# Patient Record
Sex: Female | Born: 1970 | Race: Black or African American | Hispanic: No | Marital: Married | Smoking: Never smoker
Health system: Southern US, Community
[De-identification: ages and names within clinical notes are randomized; demographics above are authoritative.]

## PROBLEM LIST (undated history)

## (undated) ENCOUNTER — Ambulatory Visit (HOSPITAL_COMMUNITY): Admission: EM

## (undated) DIAGNOSIS — K219 Gastro-esophageal reflux disease without esophagitis: Secondary | ICD-10-CM

## (undated) DIAGNOSIS — I10 Essential (primary) hypertension: Secondary | ICD-10-CM

## (undated) DIAGNOSIS — J45909 Unspecified asthma, uncomplicated: Secondary | ICD-10-CM

## (undated) DIAGNOSIS — N390 Urinary tract infection, site not specified: Secondary | ICD-10-CM

## (undated) DIAGNOSIS — Z8489 Family history of other specified conditions: Secondary | ICD-10-CM

---

## 1992-06-02 HISTORY — PX: CHOLECYSTECTOMY: SHX55

## 1996-06-02 HISTORY — PX: TUBAL LIGATION: SHX77

## 1998-04-17 ENCOUNTER — Other Ambulatory Visit: Admission: RE | Admit: 1998-04-17 | Discharge: 1998-04-17 | Payer: Self-pay | Admitting: Obstetrics

## 1998-07-09 ENCOUNTER — Emergency Department (HOSPITAL_COMMUNITY): Admission: EM | Admit: 1998-07-09 | Discharge: 1998-07-09 | Payer: Self-pay | Admitting: Emergency Medicine

## 1999-12-30 ENCOUNTER — Other Ambulatory Visit: Admission: RE | Admit: 1999-12-30 | Discharge: 1999-12-30 | Payer: Self-pay | Admitting: Obstetrics

## 2002-03-02 ENCOUNTER — Encounter: Payer: Self-pay | Admitting: Emergency Medicine

## 2002-03-02 ENCOUNTER — Emergency Department (HOSPITAL_COMMUNITY): Admission: EM | Admit: 2002-03-02 | Discharge: 2002-03-02 | Payer: Self-pay | Admitting: *Deleted

## 2002-04-30 ENCOUNTER — Emergency Department (HOSPITAL_COMMUNITY): Admission: EM | Admit: 2002-04-30 | Discharge: 2002-04-30 | Payer: Self-pay | Admitting: Emergency Medicine

## 2004-03-02 ENCOUNTER — Emergency Department (HOSPITAL_COMMUNITY): Admission: EM | Admit: 2004-03-02 | Discharge: 2004-03-02 | Payer: Self-pay | Admitting: Emergency Medicine

## 2005-08-11 ENCOUNTER — Emergency Department (HOSPITAL_COMMUNITY): Admission: EM | Admit: 2005-08-11 | Discharge: 2005-08-11 | Payer: Self-pay | Admitting: Family Medicine

## 2006-07-12 ENCOUNTER — Emergency Department (HOSPITAL_COMMUNITY): Admission: EM | Admit: 2006-07-12 | Discharge: 2006-07-12 | Payer: Self-pay | Admitting: Emergency Medicine

## 2008-06-24 ENCOUNTER — Emergency Department (HOSPITAL_COMMUNITY): Admission: EM | Admit: 2008-06-24 | Discharge: 2008-06-24 | Payer: Self-pay | Admitting: Emergency Medicine

## 2008-09-27 ENCOUNTER — Emergency Department (HOSPITAL_COMMUNITY): Admission: EM | Admit: 2008-09-27 | Discharge: 2008-09-27 | Payer: Self-pay | Admitting: Emergency Medicine

## 2008-10-20 ENCOUNTER — Emergency Department (HOSPITAL_COMMUNITY): Admission: EM | Admit: 2008-10-20 | Discharge: 2008-10-20 | Payer: Self-pay | Admitting: Emergency Medicine

## 2010-09-16 LAB — DIFFERENTIAL
Basophils Absolute: 0.1 10*3/uL (ref 0.0–0.1)
Basophils Relative: 1 % (ref 0–1)
Eosinophils Relative: 2 % (ref 0–5)
Lymphocytes Relative: 37 % (ref 12–46)
Monocytes Absolute: 0.5 10*3/uL (ref 0.1–1.0)
Monocytes Relative: 4 % (ref 3–12)

## 2010-09-16 LAB — BASIC METABOLIC PANEL
CO2: 23 mEq/L (ref 19–32)
GFR calc Af Amer: >60 mL/min (ref >60)
Glucose, Bld: 110 mg/dL — ABNORMAL HIGH (ref 70–99)
Potassium: 2.9 mEq/L — ABNORMAL LOW (ref 3.5–5.1)
Sodium: 133 mEq/L — ABNORMAL LOW (ref 135–145)

## 2010-09-16 LAB — CBC
HCT: 29.6 % — ABNORMAL LOW (ref 36.0–46.0)
Hemoglobin: 9.6 g/dL — ABNORMAL LOW (ref 12.0–15.0)
MCHC: 32.4 g/dL (ref 30.0–36.0)
RBC: 3.75 MIL/uL — ABNORMAL LOW (ref 3.87–5.11)
RDW: 17.4 % — ABNORMAL HIGH (ref 11.5–15.5)

## 2010-09-16 LAB — POCT CARDIAC MARKERS: CKMB, poc: <1 ng/mL — ABNORMAL LOW (ref 1.0–8.0)

## 2010-11-06 ENCOUNTER — Emergency Department (HOSPITAL_COMMUNITY)
Admission: EM | Admit: 2010-11-06 | Discharge: 2010-11-06 | Disposition: A | Discharge status: Home / Self Care | Payer: Self-pay | Attending: Emergency Medicine | Admitting: Emergency Medicine

## 2010-11-06 ENCOUNTER — Emergency Department (HOSPITAL_COMMUNITY): Payer: Self-pay

## 2010-11-06 DIAGNOSIS — R0602 Shortness of breath: Secondary | ICD-10-CM | POA: Insufficient documentation

## 2010-11-06 DIAGNOSIS — R072 Precordial pain: Secondary | ICD-10-CM | POA: Insufficient documentation

## 2010-11-06 DIAGNOSIS — R091 Pleurisy: Secondary | ICD-10-CM | POA: Insufficient documentation

## 2010-11-06 DIAGNOSIS — D649 Anemia, unspecified: Secondary | ICD-10-CM | POA: Insufficient documentation

## 2010-11-06 LAB — POCT I-STAT, CHEM 8
Chloride: 107 mEq/L (ref 96–112)
Glucose, Bld: 84 mg/dL (ref 70–99)
HCT: 34 % — ABNORMAL LOW (ref 36.0–46.0)
Hemoglobin: 11.6 g/dL — ABNORMAL LOW (ref 12.0–15.0)
Potassium: 3.6 mEq/L (ref 3.5–5.1)
Sodium: 142 mEq/L (ref 135–145)

## 2010-11-06 LAB — CBC
HCT: 31.7 % — ABNORMAL LOW (ref 36.0–46.0)
Hemoglobin: 10.3 g/dL — ABNORMAL LOW (ref 12.0–15.0)
MCV: 75.7 fL — ABNORMAL LOW (ref 78.0–100.0)
RBC: 4.19 MIL/uL (ref 3.87–5.11)
WBC: 10.4 10*3/uL (ref 4.0–10.5)

## 2010-11-06 LAB — DIFFERENTIAL
Basophils Absolute: 0.1 10*3/uL (ref 0.0–0.1)
Eosinophils Relative: 3 % (ref 0–5)
Lymphocytes Relative: 34 % (ref 12–46)
Lymphs Abs: 3.5 10*3/uL (ref 0.7–4.0)
Neutro Abs: 6.3 10*3/uL (ref 1.7–7.7)

## 2010-11-06 MED ORDER — IOHEXOL 300 MG/ML  SOLN
100.0000 mL | Freq: Once | INTRAMUSCULAR | Status: AC | PRN
  Administered 2010-11-06: 100 mL via INTRAVENOUS

## 2012-08-26 ENCOUNTER — Emergency Department (HOSPITAL_COMMUNITY)
Admission: EM | Admit: 2012-08-26 | Discharge: 2012-08-26 | Disposition: A | Discharge status: Home / Self Care | Payer: Medicaid Other | Attending: Emergency Medicine | Admitting: Emergency Medicine

## 2012-08-26 ENCOUNTER — Encounter (HOSPITAL_COMMUNITY): Payer: Self-pay | Admitting: *Deleted

## 2012-08-26 DIAGNOSIS — Z87440 Personal history of urinary (tract) infections: Secondary | ICD-10-CM | POA: Insufficient documentation

## 2012-08-26 DIAGNOSIS — M545 Low back pain, unspecified: Secondary | ICD-10-CM | POA: Insufficient documentation

## 2012-08-26 DIAGNOSIS — I10 Essential (primary) hypertension: Secondary | ICD-10-CM | POA: Insufficient documentation

## 2012-08-26 DIAGNOSIS — R21 Rash and other nonspecific skin eruption: Secondary | ICD-10-CM | POA: Insufficient documentation

## 2012-08-26 DIAGNOSIS — Z8719 Personal history of other diseases of the digestive system: Secondary | ICD-10-CM | POA: Insufficient documentation

## 2012-08-26 HISTORY — DX: Essential (primary) hypertension: I10

## 2012-08-26 HISTORY — DX: Gastro-esophageal reflux disease without esophagitis: K21.9

## 2012-08-26 HISTORY — DX: Urinary tract infection, site not specified: N39.0

## 2012-08-26 LAB — COMPREHENSIVE METABOLIC PANEL
ALT: 14 U/L (ref 0–35)
Alkaline Phosphatase: 81 U/L (ref 39–117)
BUN: 10 mg/dL (ref 6–23)
CO2: 28 mEq/L (ref 19–32)
Calcium: 9.3 mg/dL (ref 8.4–10.5)
GFR calc Af Amer: 79 mL/min — ABNORMAL LOW (ref >90)
GFR calc non Af Amer: 68 mL/min — ABNORMAL LOW (ref >90)
Glucose, Bld: 95 mg/dL (ref 70–99)
Sodium: 141 mEq/L (ref 135–145)
Total Protein: 8.1 g/dL (ref 6.0–8.3)

## 2012-08-26 LAB — LIPASE, BLOOD: Lipase: 40 U/L (ref 11–59)

## 2012-08-26 LAB — CBC WITH DIFFERENTIAL/PLATELET
Eosinophils Absolute: 0.3 10*3/uL (ref 0.0–0.7)
Eosinophils Relative: 3 % (ref 0–5)
HCT: 30.9 % — ABNORMAL LOW (ref 36.0–46.0)
Hemoglobin: 10 g/dL — ABNORMAL LOW (ref 12.0–15.0)
Lymphocytes Relative: 36 % (ref 12–46)
Lymphs Abs: 4.5 10*3/uL — ABNORMAL HIGH (ref 0.7–4.0)
MCH: 23.6 pg — ABNORMAL LOW (ref 26.0–34.0)
MCV: 73 fL — ABNORMAL LOW (ref 78.0–100.0)
Monocytes Relative: 3 % (ref 3–12)
Platelets: 417 10*3/uL — ABNORMAL HIGH (ref 150–400)
RBC: 4.23 MIL/uL (ref 3.87–5.11)
WBC: 12.7 10*3/uL — ABNORMAL HIGH (ref 4.0–10.5)

## 2012-08-26 LAB — URINALYSIS, ROUTINE W REFLEX MICROSCOPIC
Bilirubin Urine: NEGATIVE
Hgb urine dipstick: NEGATIVE
Nitrite: NEGATIVE
Protein, ur: 30 mg/dL — AB
Urobilinogen, UA: 1 mg/dL (ref 0.0–1.0)

## 2012-08-26 LAB — URINE MICROSCOPIC-ADD ON

## 2012-08-26 MED ORDER — OXYCODONE-ACETAMINOPHEN 5-325 MG PO TABS
1.0000 | ORAL_TABLET | Freq: Once | ORAL | Status: AC
  Administered 2012-08-26: 1 via ORAL
  Filled 2012-08-26: qty 1

## 2012-08-26 MED ORDER — DIAZEPAM 5 MG PO TABS
5.0000 mg | ORAL_TABLET | Freq: Once | ORAL | Status: AC
  Administered 2012-08-26: 5 mg via ORAL
  Filled 2012-08-26: qty 1

## 2012-08-26 MED ORDER — IBUPROFEN 200 MG PO TABS
400.0000 mg | ORAL_TABLET | Freq: Once | ORAL | Status: AC
  Administered 2012-08-26: 400 mg via ORAL
  Filled 2012-08-26: qty 2

## 2012-08-26 MED ORDER — OXYCODONE-ACETAMINOPHEN 5-325 MG PO TABS: ORAL_TABLET | ORAL | Status: DC

## 2012-08-26 NOTE — ED Provider Notes (Signed)
History     CSN: [id number]  Arrival date & time 08/26/12  4098   First MD Initiated Contact with Patient 08/26/12 1944      Chief Complaint  Patient presents with  . Abdominal Pain  . Back Pain    (Consider location/radiation/quality/duration/timing/severity/associated sxs/prior treatment) HPI  Amy Barajas is a 42 y.o. female was otherwise healthy complaining of moderate, 6/10 pain that started in the right lower quadrant that radiated to the right back starting 4 days ago. She now has no Abdominal pain and the pain is exclusive ley in the back. Pain is intermittent, there are no exacerbating or alleviating factors. She's been taking Tylenol with little relief. She had a single episode of emesis at the onset, 4 days ago. Patient states that she has had loose stool, she used the bathroom 3 times today. She denies fever, chest pain, shortness of breath, current active abdominal pain,  and symptoms or abnormal vaginal discharge. Denies dysuria but endorses frequency. Patient also states that she has a bump on the right lower back she has had for several years but she would like to know what it is.  No PCP  Past Medical History  Diagnosis Date  . Acid reflux   . Hypertension   . Chronic UTI     Past Surgical History  Procedure Laterality Date  . Cholecystectomy      No family history on file.  History  Substance Use Topics  . Smoking status: Never Smoker   . Smokeless tobacco: Not on file  . Alcohol Use: No    OB History   Grav Para Term Preterm Abortions TAB SAB Ect Mult Living                  Review of Systems  Constitutional: Negative for fever.  Respiratory: Negative for shortness of breath.   Cardiovascular: Negative for chest pain.  Gastrointestinal: Negative for nausea, vomiting, abdominal pain and diarrhea.  Musculoskeletal: Positive for back pain.  Skin: Positive for rash.  All other systems reviewed and are negative.    Allergies  Review of  patient's allergies indicates no known allergies.  Home Medications  No current outpatient prescriptions on file.  BP 141/94  Pulse 96  Temp(Src) 98.6 F (37 C) (Oral)  Resp 18  SpO2 100%  LMP 07/05/2012  Physical Exam  Nursing note and vitals reviewed. Constitutional: She is oriented to person, place, and time. She appears well-developed and well-nourished. No distress.  HENT:  Head: Normocephalic.  Mouth/Throat: Oropharynx is clear and moist.  Eyes: Conjunctivae and EOM are normal. Pupils are equal, round, and reactive to light.  Neck: Normal range of motion.  Cardiovascular: Normal rate, regular rhythm, normal heart sounds and intact distal pulses.   Pulmonary/Chest: Effort normal and breath sounds normal. No stridor. No respiratory distress. She has no wheezes. She has no rales. She exhibits no tenderness.  Abdominal: Soft. Bowel sounds are normal.  Musculoskeletal: Normal range of motion.  Neurological: She is alert and oriented to person, place, and time.  Skin:     Psychiatric: She has a normal mood and affect.    ED Course  Procedures (including critical care time)  Labs Reviewed  CBC WITH DIFFERENTIAL - Abnormal; Notable for the following:    WBC 12.7 (*)    Hemoglobin 10.0 (*)    HCT 30.9 (*)    MCV 73.0 (*)    MCH 23.6 (*)    RDW 16.9 (*)  Platelets 417 (*)    Lymphs Abs 4.5 (*)    All other components within normal limits  COMPREHENSIVE METABOLIC PANEL - Abnormal; Notable for the following:    GFR calc non Af Amer 68 (*)    GFR calc Af Amer 79 (*)    All other components within normal limits  LIPASE, BLOOD  PREGNANCY, URINE  URINALYSIS, MICROSCOPIC ONLY  URINALYSIS, ROUTINE W REFLEX MICROSCOPIC   No results found.   1. Low back pain       MDM   Amy Barajas is a 42 y.o. female with right lower back pain for 3 days. VSS, pain reproducible to palpation. Urinalysis is unremarkable and blood blood work shows a mild leukocytosis of  12.7.  Serial abdominal exams remained benign. Urinalysis is unremarkable. Blood work is grossly normal with mild leukocytosis of 12.7.  Filed Vitals:   08/26/12 1831  BP: 141/94  Pulse: 96  Temp: 98.6 F (37 C)  TempSrc: Oral  Resp: 18  SpO2: 100%     Pt verbalized understanding and agrees with care plan. Outpatient follow-up and return precautions given.    Discharge Medication List as of 08/26/2012  9:47 PM    START taking these medications   Details  oxyCODONE-acetaminophen (PERCOCET/ROXICET) 5-325 MG per tablet 1 to 2 tabs PO q6hrs  PRN for pain, Print               Wynetta Emery, PA-C 08/27/12 [phone]

## 2012-08-26 NOTE — ED Notes (Signed)
PT to ED c/o RLQ pain that radiates to her R central back.  Denies injury.  States Mon she left work d/t diarrhea and emesis.  Pt continues to have diarrhea.  Denies urinary s/s or vaginal discharge.  PT is concerned about a cyst-like bump on the R side of her back and she feels it is r/t her R abd pain.

## 2012-08-28 NOTE — ED Provider Notes (Signed)
Medical screening examination/treatment/procedure(s) were performed by non-physician practitioner and as supervising physician I was immediately available for consultation/collaboration.  Graesyn Schreifels K Linker, MD 08/28/12 0907 

## 2012-12-20 ENCOUNTER — Encounter (HOSPITAL_COMMUNITY): Payer: Self-pay | Admitting: *Deleted

## 2012-12-20 ENCOUNTER — Emergency Department (HOSPITAL_COMMUNITY)
Admission: EM | Admit: 2012-12-20 | Discharge: 2012-12-20 | Disposition: A | Discharge status: Home / Self Care | Payer: Medicaid Other | Source: Home / Self Care

## 2012-12-20 DIAGNOSIS — M7581 Other shoulder lesions, right shoulder: Secondary | ICD-10-CM

## 2012-12-20 DIAGNOSIS — I10 Essential (primary) hypertension: Secondary | ICD-10-CM

## 2012-12-20 DIAGNOSIS — G43909 Migraine, unspecified, not intractable, without status migrainosus: Secondary | ICD-10-CM

## 2012-12-20 DIAGNOSIS — G5601 Carpal tunnel syndrome, right upper limb: Secondary | ICD-10-CM

## 2012-12-20 MED ORDER — CYCLOBENZAPRINE HCL 5 MG PO TABS: ORAL_TABLET | ORAL | Status: DC

## 2012-12-20 MED ORDER — SALSALATE 750 MG PO TABS: 1500.0000 mg | ORAL_TABLET | Freq: Two times a day (BID) | ORAL | Status: DC

## 2012-12-20 MED ORDER — METOCLOPRAMIDE HCL 10 MG PO TABS: ORAL_TABLET | ORAL | Status: DC

## 2012-12-20 NOTE — ED Provider Notes (Signed)
Chief Complaint:   Chief Complaint  Patient presents with  . Headache    History of Present Illness:   Amy Barajas is a 42 year old female who presents today with a three-day history of headache. The patient states the headache began 3 days ago, fairly suddenly without any provocation. It's located in the left forehead and radiates to the face. Sharp and throbbing and associated with photophobia, phonophobia, nausea but no vomiting, and dizziness. The patient states her vision is blurry off and on but this is been going on for months. This morning she woke up with pain in the entire right arm from the neck on down to the shoulder and the arm with numbness and tingling in the hand. She denies any weakness. It hurts her to move her neck and her shoulder. She denies any diplopia, difficulty with ambulation, coordination, balance, or equilibrium, difficulty with speech or swallowing. The patient states she's had headaches like this off and on for years. She gets several per week. And she's never seen a doctor for them and never been formally diagnosed as having migraines.  Review of Systems:  Other than noted above, the patient denies any of the following symptoms: Systemic:  No fever, chills, fatigue, photophobia, stiff neck. Eye:  No redness, eye pain, discharge, blurred vision, or diplopia. ENT:  No nasal congestion, rhinorrhea, sinus pressure or pain, sneezing, earache, or sore throat.  No jaw claudication. Neuro:  No paresthesias, loss of consciousness, seizure activity, muscle weakness, trouble with coordination or gait, trouble speaking or swallowing. Psych:  No depression, anxiety or trouble sleeping.  PMFSH:  Past medical history, family history, social history, meds, and allergies were reviewed.  She has had bilateral tubal ligation.  Physical Exam:   Vital signs:  BP 178/105  Pulse 78  Temp(Src) 98.6 F (37 C) (Oral)  Resp 18  SpO2 100%  LMP 12/20/2012 General:  Alert and oriented.   In no distress. Eye:  Lids and conjunctivas normal.  PERRL,  Full EOMs.  Fundi benign with normal discs and vessels. ENT:  There is tenderness to palpation over the left temple and the face.  TMs and canals clear.  Nasal mucosa was normal and uncongested without any drainage. No intra oral lesions, pharynx clear, mucous membranes moist, dentition normal. Neck:  Supple, full ROM, there is tenderness to palpation over the right trapezius ridge.  No adenopathy or mass. Extremities: She has diffuse pain in the entire right arm from the shoulder on down to the hand. There is pain with abduction of the shoulder and impingement signs are positive. She also has pain to palpation over all the muscle and joint areas of the arm and hand. There is no swelling, pulses are full. All joints have a full range of motion actively and passively. Tinel's and Phalen's signs are positive. Neuro:  Alert and orented times 3.  Speech was clear, fluent, and appropriate.  Cranial nerves intact. No pronator drift, muscle strength normal. Finger to nose normal.  Sensation was normal over the face and upper extremities to light touch. DTRs were 2+ and symmetrical.Station and gait were normal.  Romberg's sign was normal.  Able to perform tandem gait well. Psych:  Normal affect.  Course in Urgent Care Center:   The patient declines any injections for migraine headache.  Assessment:  The primary encounter diagnosis was Migraine headache. Diagnoses of Rotator cuff tendonitis, right, Carpal tunnel syndrome, right, and Hypertension were also pertinent to this visit.  I think she  has several problems. First of all she has a migraine headache and it sounds like these been going on for a while, social need followup with her primary care physician for this. Second she also appears to have rotator cuff tendinitis and also possibly carpal tunnel syndrome.  Plan:   1.  The following meds were prescribed:   Discharge Medication List as of  12/20/2012  9:31 AM    START taking these medications   Details  cyclobenzaprine (FLEXERIL) 5 MG tablet 1 to 2 every 8 hours as needed for headache or shoulder pain., Normal    metoCLOPramide (REGLAN) 10 MG tablet Take 1 every 6 hours as needed for migraine headache, Normal    salsalate (DISALCID) 750 MG tablet Take 2 tablets (1,500 mg total) by mouth 2 (two) times daily., Starting 12/20/2012, Until Discontinued, Normal        Since her blood pressure is high, did not want to give her Imitrex or anything else that might elevate her blood pressure further. I'm going to try Flexeril and Reglan and Disalcid for the musculoskeletal complaints.  2.  The patient was instructed in symptomatic care and handouts were given. 3.  The patient was told to return if becoming worse in any way, if no better in 3 or 4 days, and given some red flag symptoms such as fever or any changing neurological symptoms that would indicate earlier return. 4.  Follow up with a primary care physician as soon as possible to recheck her blood pressure and followup about the headaches and the musculoskeletal issues.    Reuben Likes, MD 12/20/12 [phone]

## 2012-12-20 NOTE — ED Notes (Signed)
Pt  Has  Headache         With  Numbness      Right  Arm  With   Some  Tingling       In the  Affected  Arm          Tried  To  Go  Work  Today  But  Was   Unable  Due  To  Symptoms       Ambulated  To  Room with a  Steady  Fluid  Gait       She  Is  Awake  And  Alert              And  Oriented

## 2013-09-07 ENCOUNTER — Encounter (HOSPITAL_COMMUNITY): Payer: Self-pay | Admitting: Emergency Medicine

## 2013-09-07 ENCOUNTER — Emergency Department (HOSPITAL_COMMUNITY): Payer: Medicaid Other

## 2013-09-07 DIAGNOSIS — M79609 Pain in unspecified limb: Secondary | ICD-10-CM | POA: Insufficient documentation

## 2013-09-07 DIAGNOSIS — Z87440 Personal history of urinary (tract) infections: Secondary | ICD-10-CM | POA: Insufficient documentation

## 2013-09-07 DIAGNOSIS — J45901 Unspecified asthma with (acute) exacerbation: Secondary | ICD-10-CM | POA: Insufficient documentation

## 2013-09-07 DIAGNOSIS — I10 Essential (primary) hypertension: Secondary | ICD-10-CM | POA: Insufficient documentation

## 2013-09-07 DIAGNOSIS — B9789 Other viral agents as the cause of diseases classified elsewhere: Secondary | ICD-10-CM | POA: Insufficient documentation

## 2013-09-07 DIAGNOSIS — Z8719 Personal history of other diseases of the digestive system: Secondary | ICD-10-CM | POA: Insufficient documentation

## 2013-09-07 LAB — I-STAT TROPONIN, ED: TROPONIN I, POC: 0 ng/mL (ref 0.00–0.08)

## 2013-09-07 LAB — CBC
HCT: 30.2 % — ABNORMAL LOW (ref 36.0–46.0)
Hemoglobin: 10 g/dL — ABNORMAL LOW (ref 12.0–15.0)
MCH: 24 pg — ABNORMAL LOW (ref 26.0–34.0)
MCHC: 33.1 g/dL (ref 30.0–36.0)
MCV: 72.4 fL — ABNORMAL LOW (ref 78.0–100.0)
PLATELETS: 348 10*3/uL (ref 150–400)
RBC: 4.17 MIL/uL (ref 3.87–5.11)
RDW: 17.2 % — ABNORMAL HIGH (ref 11.5–15.5)
WBC: 7.4 10*3/uL (ref 4.0–10.5)

## 2013-09-07 LAB — PRO B NATRIURETIC PEPTIDE: PRO B NATRI PEPTIDE: 68.1 pg/mL (ref 0–125)

## 2013-09-07 LAB — BASIC METABOLIC PANEL
BUN: 10 mg/dL (ref 6–23)
CALCIUM: 9.1 mg/dL (ref 8.4–10.5)
CO2: 21 meq/L (ref 19–32)
Chloride: 98 mEq/L (ref 96–112)
Creatinine, Ser: 1.05 mg/dL (ref 0.50–1.10)
GFR, EST AFRICAN AMERICAN: 74 mL/min — AB (ref >90)
GFR, EST NON AFRICAN AMERICAN: 64 mL/min — AB (ref >90)
Glucose, Bld: 95 mg/dL (ref 70–99)
Potassium: 4.1 mEq/L (ref 3.7–5.3)
SODIUM: 136 meq/L — AB (ref 137–147)

## 2013-09-07 MED ORDER — ACETAMINOPHEN 325 MG PO TABS
650.0000 mg | ORAL_TABLET | Freq: Once | ORAL | Status: AC
  Administered 2013-09-07: 650 mg via ORAL
  Filled 2013-09-07: qty 2

## 2013-09-07 NOTE — ED Notes (Signed)
Pt arrived  From home with c/o HA, CP, productive cough, weakness, dizziness, light headedness, back pain, N/V/D.

## 2013-09-08 ENCOUNTER — Emergency Department (HOSPITAL_COMMUNITY)
Admission: EM | Admit: 2013-09-08 | Discharge: 2013-09-08 | Disposition: A | Discharge status: Home / Self Care | Payer: Medicaid Other | Attending: Emergency Medicine | Admitting: Emergency Medicine

## 2013-09-08 DIAGNOSIS — J40 Bronchitis, not specified as acute or chronic: Secondary | ICD-10-CM

## 2013-09-08 DIAGNOSIS — B349 Viral infection, unspecified: Secondary | ICD-10-CM

## 2013-09-08 MED ORDER — ALBUTEROL SULFATE HFA 108 (90 BASE) MCG/ACT IN AERS: 1.0000 | INHALATION_SPRAY | Freq: Four times a day (QID) | RESPIRATORY_TRACT | Status: DC | PRN

## 2013-09-08 MED ORDER — IBUPROFEN 600 MG PO TABS: 600.0000 mg | ORAL_TABLET | Freq: Four times a day (QID) | ORAL | Status: DC | PRN

## 2013-09-08 MED ORDER — AZITHROMYCIN 250 MG PO TABS: 250.0000 mg | ORAL_TABLET | Freq: Every day | ORAL | Status: DC

## 2013-09-08 MED ORDER — PREDNISONE 50 MG PO TABS: 50.0000 mg | ORAL_TABLET | Freq: Every day | ORAL | Status: DC

## 2013-09-08 NOTE — ED Provider Notes (Signed)
CSN: [id number]     Arrival date & time 09/07/13  2011 History   First MD Initiated Contact with Patient 09/08/13 [phone]     Chief Complaint  Patient presents with  . Chest Pain     (Consider location/radiation/quality/duration/timing/severity/associated sxs/prior Treatment) HPI Comments: Pt with hx of HTN comes in with cc of chest pain and leg pain. Chest pain since Wed, bilateral, intermittent, and it is wrose with coughing, breathing. Pt has yellow mucus. + fevers. + asthma hx, and pt is wheezing. Pt also has bilateral leg pain, and pt woke u with it. Pain is described as crampy, intermittent as well. No hx DVT, PE and no risK factors for the same.  Patient is a 43 y.o. female presenting with chest pain. The history is provided by the patient.  Chest Pain Associated symptoms: cough and shortness of breath   Associated symptoms: no abdominal pain, no headache, no nausea and not vomiting     Past Medical History  Diagnosis Date  . Acid reflux   . Hypertension   . Chronic UTI    Past Surgical History  Procedure Laterality Date  . Cholecystectomy     History reviewed. No pertinent family history. History  Substance Use Topics  . Smoking status: Never Smoker   . Smokeless tobacco: Never Used  . Alcohol Use: No   OB History   Grav Para Term Preterm Abortions TAB SAB Ect Mult Living                 Review of Systems  Constitutional: Negative for activity change.  Respiratory: Positive for cough, shortness of breath and wheezing.   Cardiovascular: Positive for chest pain.  Gastrointestinal: Negative for nausea, vomiting and abdominal pain.  Genitourinary: Negative for dysuria.  Musculoskeletal: Positive for myalgias. Negative for neck pain.  Neurological: Negative for headaches.  All other systems reviewed and are negative.     Allergies  Review of patient's allergies indicates no known allergies.  Home Medications   Current Outpatient Rx  Name  Route  Sig   Dispense  Refill  . acetaminophen (TYLENOL) 500 MG tablet   Oral   Take 500 mg by mouth 2 (two) times daily as needed for pain.         . Aspirin-Acetaminophen-Caffeine (GOODY HEADACHE PO)   Oral   Take 1 packet by mouth 2 (two) times daily as needed (for pain).          BP 133/84  Pulse 98  Temp(Src) 98.6 F (37 C) (Oral)  Resp 21  Ht 5\' 9"  (1.753 m)  SpO2 99%  LMP 09/06/2013 Physical Exam  Nursing note and vitals reviewed. Constitutional: She is oriented to person, place, and time. She appears well-developed and well-nourished.  HENT:  Head: Normocephalic and atraumatic.  Eyes: EOM are normal. Pupils are equal, round, and reactive to light.  Neck: Neck supple.  Cardiovascular: Normal rate, regular rhythm and normal heart sounds.   No murmur heard. Pulmonary/Chest: Effort normal. No respiratory distress. She has no wheezes.  Abdominal: Soft. She exhibits no distension. There is no tenderness. There is no rebound and no guarding.  Musculoskeletal: She exhibits no edema and no tenderness.  Neurological: She is alert and oriented to person, place, and time.  Skin: Skin is warm and dry.    ED Course  Procedures (including critical care time) Labs Review Labs Reviewed  BASIC METABOLIC PANEL - Abnormal; Notable for the following:    Sodium 136 (*)  GFR calc non Af Amer 64 (*)    GFR calc Af Amer 74 (*)    All other components within normal limits  CBC - Abnormal; Notable for the following:    Hemoglobin 10.0 (*)    HCT 30.2 (*)    MCV 72.4 (*)    MCH 24.0 (*)    RDW 17.2 (*)    All other components within normal limits  PRO B NATRIURETIC PEPTIDE  I-STAT TROPOININ, ED   Imaging Review Dg Chest 2 View  09/07/2013   CLINICAL DATA:  Chest pain and productive cough.  EXAM: CHEST  2 VIEW  COMPARISON:  11/06/2010  FINDINGS: Lungs are adequately inflated without consolidation or effusion. Cardiomediastinal silhouette and remainder the exam is unchanged.  IMPRESSION: No  active cardiopulmonary disease.   Electronically Signed   By: Marin Olp M.D.   On: 09/07/2013 21:21     EKG Interpretation None       Date: 09/08/2013  Rate: 119  Rhythm: normal sinus rhythm  QRS Axis: normal  Intervals: normal  ST/T Wave abnormalities: normal  Conduction Disutrbances: none  Narrative Interpretation: unremarkable     MDM   Final diagnoses:  Bronchitis  Viral syndrome     Pt comes in with cc of cough, chest pain, weakness, wheezing, bilateral leg pain. Leg pain on going for a while. No signs of infection, pitting edema, clinically no concerns for DVT.  Pt's rest of the sx are subacute. She has pleuritic type chest pain, diffuse, and states she is wheezing at home. Exam is benign. She has a mild fever. Tachycardic at arrival, but now HR is in the 90s  Pt has no hx of PE, DVT, no risk factors the same.  Since the sx are new, and are associated with constellation of sx consistent with URI  - i dont think we need to woek up for DVT. WE have advised her to return to the ER if sx get worse.   Given fevers, with the cough and uri like sx, AZT given for wait and see approach.   Varney Biles, MD 09/08/13 2751

## 2013-09-08 NOTE — Discharge Instructions (Signed)
Bronchitis °Bronchitis is inflammation of the airways that extend from the windpipe into the lungs (bronchi). The inflammation often causes mucus to develop, which leads to a cough. If the inflammation becomes severe, it may cause shortness of breath. °CAUSES  °Bronchitis may be caused by:  °· Viral infections.   °· Bacteria.   °· Cigarette smoke.   °· Allergens, pollutants, and other irritants.   °SIGNS AND SYMPTOMS  °The most common symptom of bronchitis is a frequent cough that produces mucus. Other symptoms include: °· Fever.   °· Body aches.   °· Chest congestion.   °· Chills.   °· Shortness of breath.   °· Sore throat.   °DIAGNOSIS  °Bronchitis is usually diagnosed through a medical history and physical exam. Tests, such as chest X-rays, are sometimes done to rule out other conditions.  °TREATMENT  °You may need to avoid contact with whatever caused the problem (smoking, for example). Medicines are sometimes needed. These may include: °· Antibiotics. These may be prescribed if the condition is caused by bacteria. °· Cough suppressants. These may be prescribed for relief of cough symptoms.   °· Inhaled medicines. These may be prescribed to help open your airways and make it easier for you to breathe.   °· Steroid medicines. These may be prescribed for those with recurrent (chronic) bronchitis. °HOME CARE INSTRUCTIONS °· Get plenty of rest.   °· Drink enough fluids to keep your urine clear or pale yellow (unless you have a medical condition that requires fluid restriction). Increasing fluids may help thin your secretions and will prevent dehydration.   °· Only take over-the-counter or prescription medicines as directed by your health care provider. °· Only take antibiotics as directed. Make sure you finish them even if you start to feel better. °· Avoid secondhand smoke, irritating chemicals, and strong fumes. These will make bronchitis worse. If you are a smoker, quit smoking. Consider using nicotine gum or  skin patches to help control withdrawal symptoms. Quitting smoking will help your lungs heal faster.   °· Put a cool-mist humidifier in your bedroom at night to moisten the air. This may help loosen mucus. Change the water in the humidifier daily. You can also run the hot water in your shower and sit in the bathroom with the door closed for 5 10 minutes.   °· Follow up with your health care provider as directed.   °· Wash your hands frequently to avoid catching bronchitis again or spreading an infection to others.   °SEEK MEDICAL CARE IF: °Your symptoms do not improve after 1 week of treatment.  °SEEK IMMEDIATE MEDICAL CARE IF: °· Your fever increases. °· You have chills.   °· You have chest pain.   °· You have worsening shortness of breath.   °· You have bloody sputum. °· You faint.   °· You have lightheadedness. °· You have a severe headache.   °· You vomit repeatedly. °MAKE SURE YOU:  °· Understand these instructions. °· Will watch your condition. °· Will get help right away if you are not doing well or get worse. °Document Released: 05/19/2005 Document Revised: 03/09/2013 Document Reviewed: 01/11/2013 °[employer]® Patient Information ©2014 [employer]. °Viral Infections °A viral infection can be caused by different types of viruses. Most viral infections are not serious and resolve on their own. However, some infections may cause severe symptoms and may lead to further complications. °SYMPTOMS °Viruses can frequently cause: °· Minor sore throat. °· Aches and pains. °· Headaches. °· Runny nose. °· Different types of rashes. °· Watery eyes. °· Tiredness. °· Cough. °· Loss of appetite. °· Gastrointestinal infections, resulting in nausea, vomiting,   and diarrhea. °These symptoms do not respond to antibiotics because the infection is not caused by bacteria. However, you might catch a bacterial infection following the viral infection. This is sometimes called a "superinfection." Symptoms of such a bacterial infection  may include: °· Worsening sore throat with pus and difficulty swallowing. °· Swollen neck glands. °· Chills and a high or persistent fever. °· Severe headache. °· Tenderness over the sinuses. °· Persistent overall ill feeling (malaise), muscle aches, and tiredness (fatigue). °· Persistent cough. °· Yellow, green, or brown mucus production with coughing. °HOME CARE INSTRUCTIONS  °· Only take over-the-counter or prescription medicines for pain, discomfort, diarrhea, or fever as directed by your caregiver. °· Drink enough water and fluids to keep your urine clear or pale yellow. Sports drinks can provide valuable electrolytes, sugars, and hydration. °· Get plenty of rest and maintain proper nutrition. Soups and broths with crackers or rice are fine. °SEEK IMMEDIATE MEDICAL CARE IF:  °· You have severe headaches, shortness of breath, chest pain, neck pain, or an unusual rash. °· You have uncontrolled vomiting, diarrhea, or you are unable to keep down fluids. °· You or your child has an oral temperature above 102° F (38.9° C), not controlled by medicine. °· Your baby is older than 3 months with a rectal temperature of 102° F (38.9° C) or higher. °· Your baby is 3 months old or younger with a rectal temperature of 100.4° F (38° C) or higher. °MAKE SURE YOU:  °· Understand these instructions. °· Will watch your condition. °· Will get help right away if you are not doing well or get worse. °Document Released: 02/26/2005 Document Revised: 08/11/2011 Document Reviewed: 09/23/2010 °[employer]® Patient Information ©2014 [employer]. ° °

## 2014-02-18 ENCOUNTER — Emergency Department (HOSPITAL_COMMUNITY)
Admission: EM | Admit: 2014-02-18 | Discharge: 2014-02-18 | Disposition: A | Discharge status: Home / Self Care | Payer: Medicaid Other | Attending: Emergency Medicine | Admitting: Emergency Medicine

## 2014-02-18 ENCOUNTER — Encounter (HOSPITAL_COMMUNITY): Payer: Self-pay | Admitting: Emergency Medicine

## 2014-02-18 DIAGNOSIS — Z792 Long term (current) use of antibiotics: Secondary | ICD-10-CM | POA: Insufficient documentation

## 2014-02-18 DIAGNOSIS — I10 Essential (primary) hypertension: Secondary | ICD-10-CM | POA: Insufficient documentation

## 2014-02-18 DIAGNOSIS — S298XXA Other specified injuries of thorax, initial encounter: Secondary | ICD-10-CM | POA: Insufficient documentation

## 2014-02-18 DIAGNOSIS — Z7982 Long term (current) use of aspirin: Secondary | ICD-10-CM | POA: Insufficient documentation

## 2014-02-18 DIAGNOSIS — IMO0002 Reserved for concepts with insufficient information to code with codable children: Secondary | ICD-10-CM | POA: Insufficient documentation

## 2014-02-18 DIAGNOSIS — S139XXA Sprain of joints and ligaments of unspecified parts of neck, initial encounter: Secondary | ICD-10-CM | POA: Insufficient documentation

## 2014-02-18 DIAGNOSIS — Y92410 Unspecified street and highway as the place of occurrence of the external cause: Secondary | ICD-10-CM | POA: Insufficient documentation

## 2014-02-18 DIAGNOSIS — S0990XA Unspecified injury of head, initial encounter: Secondary | ICD-10-CM | POA: Insufficient documentation

## 2014-02-18 DIAGNOSIS — S161XXA Strain of muscle, fascia and tendon at neck level, initial encounter: Secondary | ICD-10-CM

## 2014-02-18 DIAGNOSIS — Z8719 Personal history of other diseases of the digestive system: Secondary | ICD-10-CM | POA: Insufficient documentation

## 2014-02-18 DIAGNOSIS — S199XXA Unspecified injury of neck, initial encounter: Secondary | ICD-10-CM

## 2014-02-18 DIAGNOSIS — Z87440 Personal history of urinary (tract) infections: Secondary | ICD-10-CM | POA: Insufficient documentation

## 2014-02-18 DIAGNOSIS — Y9389 Activity, other specified: Secondary | ICD-10-CM | POA: Insufficient documentation

## 2014-02-18 DIAGNOSIS — Z79899 Other long term (current) drug therapy: Secondary | ICD-10-CM | POA: Insufficient documentation

## 2014-02-18 DIAGNOSIS — S0993XA Unspecified injury of face, initial encounter: Secondary | ICD-10-CM | POA: Insufficient documentation

## 2014-02-18 MED ORDER — CYCLOBENZAPRINE HCL 10 MG PO TABS: 10.0000 mg | ORAL_TABLET | Freq: Three times a day (TID) | ORAL | Status: DC | PRN

## 2014-02-18 MED ORDER — ACETAMINOPHEN 325 MG PO TABS
650.0000 mg | ORAL_TABLET | Freq: Once | ORAL | Status: AC
  Administered 2014-02-18: 650 mg via ORAL
  Filled 2014-02-18: qty 2

## 2014-02-18 MED ORDER — IBUPROFEN 800 MG PO TABS: 800.0000 mg | ORAL_TABLET | Freq: Three times a day (TID) | ORAL | Status: DC
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
## 2014-02-19 NOTE — ED Provider Notes (Signed)
Medical screening examination/treatment/procedure(s) were performed by non-physician practitioner and as supervising physician I was immediately available for consultation/collaboration.   EKG Interpretation None        Delice Bison Kemp Gomes, DO 02/19/14 2446

## 2014-03-01 ENCOUNTER — Encounter: Payer: Self-pay | Admitting: Internal Medicine

## 2014-03-01 ENCOUNTER — Ambulatory Visit: Payer: Medicaid Other | Attending: Internal Medicine | Admitting: Internal Medicine

## 2014-03-01 VITALS — BP 141/88 | HR 90 | Temp 98.2°F | Resp 16 | Ht 69.5 in | Wt 223.0 lb

## 2014-03-01 DIAGNOSIS — M542 Cervicalgia: Secondary | ICD-10-CM

## 2014-03-01 DIAGNOSIS — Z792 Long term (current) use of antibiotics: Secondary | ICD-10-CM | POA: Insufficient documentation

## 2014-03-01 DIAGNOSIS — L304 Erythema intertrigo: Secondary | ICD-10-CM

## 2014-03-01 DIAGNOSIS — L538 Other specified erythematous conditions: Secondary | ICD-10-CM

## 2014-03-01 DIAGNOSIS — Z2821 Immunization not carried out because of patient refusal: Secondary | ICD-10-CM

## 2014-03-01 DIAGNOSIS — K219 Gastro-esophageal reflux disease without esophagitis: Secondary | ICD-10-CM | POA: Insufficient documentation

## 2014-03-01 DIAGNOSIS — Z0000 Encounter for general adult medical examination without abnormal findings: Secondary | ICD-10-CM

## 2014-03-01 DIAGNOSIS — I10 Essential (primary) hypertension: Secondary | ICD-10-CM

## 2014-03-01 DIAGNOSIS — R51 Headache: Secondary | ICD-10-CM | POA: Insufficient documentation

## 2014-03-01 DIAGNOSIS — R519 Headache, unspecified: Secondary | ICD-10-CM

## 2014-03-01 MED ORDER — CYCLOBENZAPRINE HCL 10 MG PO TABS: 10.0000 mg | ORAL_TABLET | Freq: Three times a day (TID) | ORAL | Status: DC | PRN

## 2014-03-01 MED ORDER — CLOTRIMAZOLE-BETAMETHASONE 1-0.05 % EX CREA: 1.0000 "application " | TOPICAL_CREAM | Freq: Two times a day (BID) | CUTANEOUS | Status: DC

## 2014-03-01 MED ORDER — AMLODIPINE BESYLATE 5 MG PO TABS: 5.0000 mg | ORAL_TABLET | Freq: Every day | ORAL | Status: DC
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
## 2014-03-01 NOTE — Patient Instructions (Signed)

## 2014-03-15 ENCOUNTER — Ambulatory Visit: Payer: Self-pay | Attending: Internal Medicine

## 2014-03-15 NOTE — Patient Instructions (Signed)
Today your Blood pressure is great today. Continue taking your medications as prescribed. Return in a week to recheck BP and get labs.

## 2014-03-15 NOTE — Progress Notes (Unsigned)
Pt is here for a BP check after she was put on amlodipine.

## 2014-11-07 ENCOUNTER — Ambulatory Visit: Payer: Self-pay | Attending: Internal Medicine | Admitting: Internal Medicine

## 2014-11-07 ENCOUNTER — Encounter: Payer: Self-pay | Admitting: Internal Medicine

## 2014-11-07 VITALS — BP 124/87 | HR 94 | Resp 16 | Ht 69.0 in | Wt 226.0 lb

## 2014-11-07 DIAGNOSIS — L304 Erythema intertrigo: Secondary | ICD-10-CM

## 2014-11-07 DIAGNOSIS — I10 Essential (primary) hypertension: Secondary | ICD-10-CM

## 2014-11-07 DIAGNOSIS — N926 Irregular menstruation, unspecified: Secondary | ICD-10-CM

## 2014-11-07 LAB — POCT URINE PREGNANCY: Preg Test, Ur: NEGATIVE

## 2014-11-07 MED ORDER — CLOTRIMAZOLE-BETAMETHASONE 1-0.05 % EX CREA: 1.0000 "application " | TOPICAL_CREAM | Freq: Two times a day (BID) | CUTANEOUS | Status: DC

## 2014-11-07 MED ORDER — AMLODIPINE BESYLATE 5 MG PO TABS: 5.0000 mg | ORAL_TABLET | Freq: Every day | ORAL | Status: DC

## 2014-11-07 NOTE — Progress Notes (Signed)
Patient ID: Amy Barajas, female   DOB: December 07, 1970, 44 y.o.   MRN: [id number] Subjective:  Amy Barajas is a 44 y.o. female with hypertension. She states that she has not had a period in 3 months. They were normal before then. Some hot flashes.   Current Outpatient Prescriptions  Medication Sig Dispense Refill  . acetaminophen (TYLENOL) 500 MG tablet Take 500 mg by mouth 2 (two) times daily as needed for pain.    Marland Kitchen albuterol (PROVENTIL HFA;VENTOLIN HFA) 108 (90 BASE) MCG/ACT inhaler Inhale 1-2 puffs into the lungs every 6 (six) hours as needed for wheezing or shortness of breath. 1 Inhaler 0  . amLODipine (NORVASC) 5 MG tablet Take 1 tablet (5 mg total) by mouth daily. 30 tablet 3  . Aspirin-Acetaminophen-Caffeine (GOODY HEADACHE PO) Take 1 packet by mouth 2 (two) times daily as needed (for pain).    . clotrimazole-betamethasone (LOTRISONE) cream Apply 1 application topically 2 (two) times daily. 45 g 1  . cyclobenzaprine (FLEXERIL) 10 MG tablet Take 1 tablet (10 mg total) by mouth 3 (three) times daily as needed for muscle spasms. 60 tablet 0  . ibuprofen (ADVIL,MOTRIN) 600 MG tablet Take 1 tablet (600 mg total) by mouth every 6 (six) hours as needed. 30 tablet 0  . ibuprofen (ADVIL,MOTRIN) 800 MG tablet Take 1 tablet (800 mg total) by mouth 3 (three) times daily. 21 tablet 0  . predniSONE (DELTASONE) 50 MG tablet Take 1 tablet (50 mg total) by mouth daily. 5 tablet 0  . azithromycin (ZITHROMAX) 250 MG tablet Take 1 tablet (250 mg total) by mouth daily. Take first 2 tablets together, then 1 every day until finished. (Patient not taking: Reported on 11/07/2014) 6 tablet 0   No current facility-administered medications for this visit.    Hypertension ROS: taking medications as instructed, no medication side effects noted, no TIA's, no chest pain on exertion, no dyspnea on exertion, no swelling of ankles and no palpitations.  New concerns: skin rash in groin that has come back from last  year.  Objective:  BP 124/87 mmHg  Pulse 94  Resp 16  Ht 5\' 9"  (1.753 m)  Wt 226 lb (102.513 kg)  BMI 33.36 kg/m2  SpO2 100%  LMP 08/01/2014  Appearance alert, well appearing, and in no distress and oriented to person, place, and time. General exam BP noted to be well controlled today in office, S1, S2 normal, no gallop, no murmur, chest clear, no JVD, no HSM, no edema.  Lab review: labs are reviewed, up to date and normal.    Amy Barajas was seen today for follow-up and hypertension.  Diagnoses and all orders for this visit:  Essential hypertension Orders: -     amLODipine (NORVASC) 5 MG tablet; Take 1 tablet (5 mg total) by mouth daily. Assessment:   Hypertension well controlled.   Plan:  Current treatment plan is effective, no change in therapy. Reviewed diet, exercise and weight control. Recommended sodium restriction. Very strongly urged to quit smoking to reduce cardiovascular risk..  Irregular menstrual cycle Orders: -     POCT urine pregnancy--negative  Patient may be going through menopause. I have explained signs and symptoms of menopause to patient.   Intertrigo Orders: -    refills clotrimazole-betamethasone (LOTRISONE) cream; Apply 1 application topically 2 (two) times daily.  Return in about 1 week (around 11/14/2014) for Lab Visit and 6 mo , Hypertension.   Chari Manning, NP 11/08/2014 2:01 PM

## 2014-11-07 NOTE — Progress Notes (Signed)
F/U HTN Irregular menses.

## 2014-11-08 DIAGNOSIS — I10 Essential (primary) hypertension: Secondary | ICD-10-CM | POA: Insufficient documentation

## 2014-11-16 ENCOUNTER — Other Ambulatory Visit: Payer: Self-pay

## 2014-11-17 ENCOUNTER — Emergency Department (HOSPITAL_COMMUNITY)
Admission: EM | Admit: 2014-11-17 | Discharge: 2014-11-17 | Disposition: A | Discharge status: Home / Self Care | Payer: Self-pay | Attending: Emergency Medicine | Admitting: Emergency Medicine

## 2014-11-17 ENCOUNTER — Encounter (HOSPITAL_COMMUNITY): Payer: Self-pay | Admitting: *Deleted

## 2014-11-17 DIAGNOSIS — J069 Acute upper respiratory infection, unspecified: Secondary | ICD-10-CM | POA: Insufficient documentation

## 2014-11-17 DIAGNOSIS — Z79899 Other long term (current) drug therapy: Secondary | ICD-10-CM | POA: Insufficient documentation

## 2014-11-17 DIAGNOSIS — J029 Acute pharyngitis, unspecified: Secondary | ICD-10-CM | POA: Insufficient documentation

## 2014-11-17 DIAGNOSIS — Z8719 Personal history of other diseases of the digestive system: Secondary | ICD-10-CM | POA: Insufficient documentation

## 2014-11-17 DIAGNOSIS — I10 Essential (primary) hypertension: Secondary | ICD-10-CM | POA: Insufficient documentation

## 2014-11-17 DIAGNOSIS — H9203 Otalgia, bilateral: Secondary | ICD-10-CM | POA: Insufficient documentation

## 2014-11-17 DIAGNOSIS — Z87440 Personal history of urinary (tract) infections: Secondary | ICD-10-CM | POA: Insufficient documentation

## 2014-11-17 DIAGNOSIS — J45909 Unspecified asthma, uncomplicated: Secondary | ICD-10-CM | POA: Insufficient documentation

## 2014-11-17 DIAGNOSIS — B9789 Other viral agents as the cause of diseases classified elsewhere: Secondary | ICD-10-CM

## 2014-11-17 HISTORY — DX: Unspecified asthma, uncomplicated: J45.909

## 2014-11-17 LAB — RAPID STREP SCREEN (MED CTR MEBANE ONLY): Streptococcus, Group A Screen (Direct): NEGATIVE

## 2014-11-17 MED ORDER — FLUTICASONE PROPIONATE 50 MCG/ACT NA SUSP
2.0000 | Freq: Every day | NASAL | Status: DC
  Administered 2014-11-17: 2 via NASAL
  Filled 2014-11-17 (×2): qty 16

## 2014-11-17 MED ORDER — NAPROXEN 500 MG PO TABS: 500.0000 mg | ORAL_TABLET | Freq: Two times a day (BID) | ORAL | Status: DC

## 2014-11-17 MED ORDER — BENZONATATE 100 MG PO CAPS: 100.0000 mg | ORAL_CAPSULE | Freq: Three times a day (TID) | ORAL | Status: DC

## 2014-11-17 MED ORDER — HYDROCODONE-HOMATROPINE 5-1.5 MG/5ML PO SYRP
5.0000 mL | ORAL_SOLUTION | Freq: Once | ORAL | Status: AC
  Administered 2014-11-17: 5 mL via ORAL
  Filled 2014-11-17: qty 5

## 2014-11-17 MED ORDER — NAPROXEN 500 MG PO TABS
500.0000 mg | ORAL_TABLET | Freq: Once | ORAL | Status: AC
  Administered 2014-11-17: 500 mg via ORAL
  Filled 2014-11-17: qty 1

## 2014-11-17 MED ORDER — HYDROCODONE-HOMATROPINE 5-1.5 MG/5ML PO SYRP: 5.0000 mL | ORAL_SOLUTION | Freq: Every evening | ORAL | Status: DC | PRN

## 2014-11-17 NOTE — ED Notes (Signed)
Pt states that she began having a sore throat on Sunday; pt states that it has gotten progressively worse; pt states that she is able to swallow but it is painful; pt c/o cough and congestion; pt c/o bilateral ear ache as well

## 2014-11-17 NOTE — ED Provider Notes (Signed)
CSN: [id number]     Arrival date & time 11/17/14  2117 History  This chart was scribed for non-physician provider Antonietta Breach, PA-C, working with Pamella Pert, MD by Irene Pap, ED Scribe. This patient was seen in room WTR5/WTR5 and patient care was started at 10:13 PM.    Chief Complaint  Patient presents with  . Sore Throat  . Otalgia   The history is provided by the patient. No language interpreter was used.    HPI Comments: Amy Barajas is a 44 y.o. female who presents to the Emergency Department complaining of gradually worsening sore throat, bilateral otalgia and cough onset 5 days ago. She reports associated postnasal drip, dizziness, SOB, myalgias, and pain with swallowing. She states that she has been drinking fluids and has been spitting up because of the pain with swallowing. She reports taking ibuprofen, throat lozenges and chloraseptic spray to no relief. She states that she will put peppermint in her coffee which will temporarily relieve the pain but will come back at night. She denies sick contacts. She states that she sees a doctor at the [facility] and went last week for her HTN. She states that she did not take her HTN medication today.   Past Medical History  Diagnosis Date  . Acid reflux   . Hypertension   . Chronic UTI   . Asthma    Past Surgical History  Procedure Laterality Date  . Cholecystectomy     Family History  Problem Relation Age of Onset  . Diabetes Father    History  Substance Use Topics  . Smoking status: Never Smoker   . Smokeless tobacco: Never Used  . Alcohol Use: No   OB History    No data available      Review of Systems  HENT: Positive for congestion, ear pain and sore throat.   Respiratory: Positive for cough and shortness of breath.   Musculoskeletal: Positive for myalgias.  Neurological: Positive for dizziness.  All other systems reviewed and are negative.   Allergies  Review of patient's allergies indicates no  known allergies.  Home Medications   Prior to Admission medications   Medication Sig Start Date End Date Taking? Authorizing Provider  acetaminophen (TYLENOL) 500 MG tablet Take 500 mg by mouth 2 (two) times daily as needed for pain.    Historical Provider, MD  albuterol (PROVENTIL HFA;VENTOLIN HFA) 108 (90 BASE) MCG/ACT inhaler Inhale 1-2 puffs into the lungs every 6 (six) hours as needed for wheezing or shortness of breath. 09/08/13   Varney Biles, MD  amLODipine (NORVASC) 5 MG tablet Take 1 tablet (5 mg total) by mouth daily. 11/07/14   Lance Bosch, NP  Aspirin-Acetaminophen-Caffeine (GOODY HEADACHE PO) Take 1 packet by mouth 2 (two) times daily as needed (for pain).    Historical Provider, MD  azithromycin (ZITHROMAX) 250 MG tablet Take 1 tablet (250 mg total) by mouth daily. Take first 2 tablets together, then 1 every day until finished. Patient not taking: Reported on 11/07/2014 09/08/13   Varney Biles, MD  benzonatate (TESSALON) 100 MG capsule Take 1 capsule (100 mg total) by mouth every 8 (eight) hours. 11/17/14   Antonietta Breach, PA-C  clotrimazole-betamethasone (LOTRISONE) cream Apply 1 application topically 2 (two) times daily. 11/07/14   Lance Bosch, NP  cyclobenzaprine (FLEXERIL) 10 MG tablet Take 1 tablet (10 mg total) by mouth 3 (three) times daily as needed for muscle spasms. 03/01/14   Lance Bosch, NP  HYDROcodone-homatropine [facility])  5-1.5 MG/5ML syrup Take 5 mLs by mouth at bedtime as needed for cough. 11/17/14   Antonietta Breach, PA-C  ibuprofen (ADVIL,MOTRIN) 600 MG tablet Take 1 tablet (600 mg total) by mouth every 6 (six) hours as needed. 09/08/13   Varney Biles, MD  ibuprofen (ADVIL,MOTRIN) 800 MG tablet Take 1 tablet (800 mg total) by mouth 3 (three) times daily. 02/18/14   Robyn M Hess, PA-C  naproxen (NAPROSYN) 500 MG tablet Take 1 tablet (500 mg total) by mouth 2 (two) times daily. 11/17/14   Antonietta Breach, PA-C  predniSONE (DELTASONE) 50 MG tablet Take 1 tablet (50 mg total) by  mouth daily. 09/08/13   Ankit Kathrynn Humble, MD   BP 177/111 mmHg  Pulse 95  Temp(Src) 99.2 F (37.3 C) (Oral)  Resp 20  Ht 5' 9.5" (1.765 m)  Wt 224 lb (101.606 kg)  BMI 32.62 kg/m2  SpO2 98%  LMP 08/01/2014  Physical Exam  Constitutional: She is oriented to person, place, and time. She appears well-developed and well-nourished. No distress.  Nontoxic/nonseptic appearing  HENT:  Head: Normocephalic and atraumatic.  Right Ear: Tympanic membrane, external ear and ear canal normal.  Left Ear: Tympanic membrane, external ear and ear canal normal.  Nose: Mucosal edema (mild) present. No rhinorrhea.  Mouth/Throat: Uvula is midline and mucous membranes are normal. Posterior oropharyngeal erythema present. No oropharyngeal exudate, posterior oropharyngeal edema or tonsillar abscesses.  Patient with mild posterior oropharyngeal erythema. Tonsils are 2+ bilaterally. No exudates or oral lesions. Uvula midline. Patient tolerating secretions without difficulty or drooling.  Eyes: Conjunctivae and EOM are normal. No scleral icterus.  Neck: Normal range of motion.  No nuchal rigidity or meningismus  Cardiovascular: Normal rate, regular rhythm and intact distal pulses.   Pulmonary/Chest: Effort normal. No respiratory distress. She has no wheezes.  Respirations even and unlabored. Dry, nonproductive cough appreciated at bedside.  Musculoskeletal: Normal range of motion.  Neurological: She is alert and oriented to person, place, and time. She exhibits normal muscle tone. Coordination normal.  Skin: Skin is warm and dry. No rash noted. She is not diaphoretic. No erythema. No pallor.  Psychiatric: She has a normal mood and affect. Her behavior is normal.  Nursing note and vitals reviewed.   ED Course  Procedures (including critical care time) DIAGNOSTIC STUDIES: Oxygen Saturation is 98% on RA, normal by my interpretation.    COORDINATION OF CARE: 10:17 PM-Discussed treatment plan which includes  ibuprofen, cough, throat and congestion medication with pt at bedside and pt agreed to plan; pt states that she is not driving.    Labs Review Labs Reviewed  RAPID STREP SCREEN (NOT AT [facility])  CULTURE, GROUP A STREP   Imaging Review No results found.   EKG Interpretation None      MDM   Final diagnoses:  Viral URI with cough  Pharyngitis    Patient complaining of symptoms c/w upper respiratory infection. Mild to moderate symptoms of nasal congestion, postnasal drip, and scratchy throat with cough for less than 10 days. Patient is afebrile. No concern for acute bacterial rhinosinusitis; likely viral in nature. Patient discharged with symptomatic treatment. Recommendations for follow-up with primary care physician and return precautions given. Patient agreeable to plan with no unaddressed concerns. Patient discharged in good condition; VSS.  I personally performed the services described in this documentation, which was scribed in my presence. The recorded information has been reviewed and is accurate.   Filed Vitals:   11/17/14 2139 11/17/14 2213  BP: 177/111 133/98  Pulse: 95   Temp: 99.2 F (37.3 C)   TempSrc: Oral   Resp: 20   Height: 5' 9.5" (1.765 m)   Weight: 224 lb (101.606 kg)   SpO2: 98%       Antonietta Breach, PA-C 11/17/14 2250  Pamella Pert, MD 11/18/14 1001

## 2014-11-17 NOTE — ED Notes (Signed)
Pt states that she has not taken her BP medications today

## 2014-11-17 NOTE — Discharge Instructions (Signed)
Use Flonase, 2 puffs in each nostril daily, as needed for nasal congestion. Take naproxen as prescribed for body aches. Take Tessalon as prescribed for cough. You may use Hycodan at nighttime for cough as needed. Follow-up with your primary doctor to ensure resolution of symptoms.  Upper Respiratory Infection, Adult An upper respiratory infection (URI) is also sometimes known as the common cold. The upper respiratory tract includes the nose, sinuses, throat, trachea, and bronchi. Bronchi are the airways leading to the lungs. Most people improve within 1 week, but symptoms can last up to 2 weeks. A residual cough may last even longer.  CAUSES Many different viruses can infect the tissues lining the upper respiratory tract. The tissues become irritated and inflamed and often become very moist. Mucus production is also common. A cold is contagious. You can easily spread the virus to others by oral contact. This includes kissing, sharing a glass, coughing, or sneezing. Touching your mouth or nose and then touching a surface, which is then touched by another person, can also spread the virus. SYMPTOMS  Symptoms typically develop 1 to 3 days after you come in contact with a cold virus. Symptoms vary from person to person. They may include:  Runny nose.  Sneezing.  Nasal congestion.  Sinus irritation.  Sore throat.  Loss of voice (laryngitis).  Cough.  Fatigue.  Muscle aches.  Loss of appetite.  Headache.  Low-grade fever. DIAGNOSIS  You might diagnose your own cold based on familiar symptoms, since most people get a cold 2 to 3 times a year. Your caregiver can confirm this based on your exam. Most importantly, your caregiver can check that your symptoms are not due to another disease such as strep throat, sinusitis, pneumonia, asthma, or epiglottitis. Blood tests, throat tests, and X-rays are not necessary to diagnose a common cold, but they may sometimes be helpful in excluding other more  serious diseases. Your caregiver will decide if any further tests are required. RISKS AND COMPLICATIONS  You may be at risk for a more severe case of the common cold if you smoke cigarettes, have chronic heart disease (such as heart failure) or lung disease (such as asthma), or if you have a weakened immune system. The very young and very old are also at risk for more serious infections. Bacterial sinusitis, middle ear infections, and bacterial pneumonia can complicate the common cold. The common cold can worsen asthma and chronic obstructive pulmonary disease (COPD). Sometimes, these complications can require emergency medical care and may be life-threatening. PREVENTION  The best way to protect against getting a cold is to practice good hygiene. Avoid oral or hand contact with people with cold symptoms. Wash your hands often if contact occurs. There is no clear evidence that vitamin C, vitamin E, echinacea, or exercise reduces the chance of developing a cold. However, it is always recommended to get plenty of rest and practice good nutrition. TREATMENT  Treatment is directed at relieving symptoms. There is no cure. Antibiotics are not effective, because the infection is caused by a virus, not by bacteria. Treatment may include:  Increased fluid intake. Sports drinks offer valuable electrolytes, sugars, and fluids.  Breathing heated mist or steam (vaporizer or shower).  Eating chicken soup or other clear broths, and maintaining good nutrition.  Getting plenty of rest.  Using gargles or lozenges for comfort.  Controlling fevers with ibuprofen or acetaminophen as directed by your caregiver.  Increasing usage of your inhaler if you have asthma. Zinc gel and zinc  lozenges, taken in the first 24 hours of the common cold, can shorten the duration and lessen the severity of symptoms. Pain medicines may help with fever, muscle aches, and throat pain. A variety of non-prescription medicines are  available to treat congestion and runny nose. Your caregiver can make recommendations and may suggest nasal or lung inhalers for other symptoms.  HOME CARE INSTRUCTIONS   Only take over-the-counter or prescription medicines for pain, discomfort, or fever as directed by your caregiver.  Use a warm mist humidifier or inhale steam from a shower to increase air moisture. This may keep secretions moist and make it easier to breathe.  Drink enough water and fluids to keep your urine clear or pale yellow.  Rest as needed.  Return to work when your temperature has returned to normal or as your caregiver advises. You may need to stay home longer to avoid infecting others. You can also use a face mask and careful hand washing to prevent spread of the virus. SEEK MEDICAL CARE IF:   After the first few days, you feel you are getting worse rather than better.  You need your caregiver's advice about medicines to control symptoms.  You develop chills, worsening shortness of breath, or brown or red sputum. These may be signs of pneumonia.  You develop yellow or brown nasal discharge or pain in the face, especially when you bend forward. These may be signs of sinusitis.  You develop a fever, swollen neck glands, pain with swallowing, or white areas in the back of your throat. These may be signs of strep throat. SEEK IMMEDIATE MEDICAL CARE IF:   You have a fever.  You develop severe or persistent headache, ear pain, sinus pain, or chest pain.  You develop wheezing, a prolonged cough, cough up blood, or have a change in your usual mucus (if you have chronic lung disease).  You develop sore muscles or a stiff neck. Document Released: 11/12/2000 Document Revised: 08/11/2011 Document Reviewed: 08/24/2013 [facility] Patient Information 2015 [place], [place]. This information is not intended to replace advice given to you by your health care provider. Make sure you discuss any questions you have with your  health care provider.

## 2014-11-20 LAB — CULTURE, GROUP A STREP: STREP A CULTURE: NEGATIVE

## 2015-10-18 ENCOUNTER — Emergency Department (HOSPITAL_COMMUNITY)
Admission: EM | Admit: 2015-10-18 | Discharge: 2015-10-18 | Disposition: A | Discharge status: Home / Self Care | Payer: No Typology Code available for payment source | Attending: Emergency Medicine | Admitting: Emergency Medicine

## 2015-10-18 ENCOUNTER — Emergency Department (HOSPITAL_COMMUNITY): Payer: No Typology Code available for payment source

## 2015-10-18 ENCOUNTER — Encounter (HOSPITAL_COMMUNITY): Payer: Self-pay | Admitting: *Deleted

## 2015-10-18 DIAGNOSIS — I10 Essential (primary) hypertension: Secondary | ICD-10-CM | POA: Insufficient documentation

## 2015-10-18 DIAGNOSIS — M179 Osteoarthritis of knee, unspecified: Secondary | ICD-10-CM | POA: Insufficient documentation

## 2015-10-18 DIAGNOSIS — J45909 Unspecified asthma, uncomplicated: Secondary | ICD-10-CM | POA: Insufficient documentation

## 2015-10-18 DIAGNOSIS — M25561 Pain in right knee: Secondary | ICD-10-CM

## 2015-10-18 DIAGNOSIS — M1711 Unilateral primary osteoarthritis, right knee: Secondary | ICD-10-CM

## 2015-10-18 DIAGNOSIS — Z7982 Long term (current) use of aspirin: Secondary | ICD-10-CM | POA: Insufficient documentation

## 2015-10-18 MED ORDER — MELOXICAM 7.5 MG PO TABS: 7.5000 mg | ORAL_TABLET | Freq: Every day | ORAL | Status: DC | PRN

## 2015-10-18 NOTE — ED Provider Notes (Signed)
CSN: [id number]     Arrival date & time 10/18/15  1508 History  By signing my name below, I, Amy Barajas, attest that this documentation has been prepared under the direction and in the presence of non-physician practitioner, Amy Bibles, PA-C. Electronically Signed: Rowan Barajas, Scribe. 10/18/2015. 4:44 PM.   Chief Complaint  Patient presents with  . Knee Pain    The history is provided by the patient. No language interpreter was used.   HPI Comments:  Amy Barajas is a 45 y.o. female with PMHx of HTN who presents to the Emergency Department complaining of throbbing, moderate right knee pain radiating down her right leg for the past month. She notes pain is worse at night. No alleviating factors noted or treatments attempted PTA. Pt is not currently working and is not on her feet often. Pt reports past sprain of right knee in 2005. Denies injury or trauma, numbness in right foot, weakness in right leg, swelling, fever or chills.  Past Medical History  Diagnosis Date  . Acid reflux   . Hypertension   . Chronic UTI   . Asthma    Past Surgical History  Procedure Laterality Date  . Cholecystectomy     Family History  Problem Relation Age of Onset  . Diabetes Father    Social History  Substance Use Topics  . Smoking status: Never Smoker   . Smokeless tobacco: Never Used  . Alcohol Use: No   OB History    No data available     Review of Systems  Constitutional: Negative for fever and chills.  Cardiovascular: Negative for leg swelling.  Musculoskeletal: Positive for arthralgias. Negative for joint swelling.  Skin: Negative for color change, pallor, rash and wound.  Allergic/Immunologic: Negative for immunocompromised state.  Neurological: Negative for weakness and numbness.  Hematological: Does not bruise/bleed easily.  Psychiatric/Behavioral: Negative for self-injury.   Allergies  Review of patient's allergies indicates no known allergies.  Home Medications    Prior to Admission medications   Medication Sig Start Date End Date Taking? Authorizing Provider  acetaminophen (TYLENOL) 500 MG tablet Take 500 mg by mouth 2 (two) times daily as needed for pain.    Historical Provider, MD  albuterol (PROVENTIL HFA;VENTOLIN HFA) 108 (90 BASE) MCG/ACT inhaler Inhale 1-2 puffs into the lungs every 6 (six) hours as needed for wheezing or shortness of breath. 09/08/13   Amy Biles, MD  amLODipine (NORVASC) 5 MG tablet Take 1 tablet (5 mg total) by mouth daily. 11/07/14   Lance Bosch, NP  Aspirin-Acetaminophen-Caffeine (GOODY HEADACHE PO) Take 1 packet by mouth 2 (two) times daily as needed (for pain).    Historical Provider, MD  azithromycin (ZITHROMAX) 250 MG tablet Take 1 tablet (250 mg total) by mouth daily. Take first 2 tablets together, then 1 every day until finished. Patient not taking: Reported on 11/07/2014 09/08/13   Amy Biles, MD  benzonatate (TESSALON) 100 MG capsule Take 1 capsule (100 mg total) by mouth every 8 (eight) hours. 11/17/14   Amy Breach, PA-C  clotrimazole-betamethasone (LOTRISONE) cream Apply 1 application topically 2 (two) times daily. Patient not taking: Reported on 11/17/2014 11/07/14   Lance Bosch, NP  cyclobenzaprine (FLEXERIL) 10 MG tablet Take 1 tablet (10 mg total) by mouth 3 (three) times daily as needed for muscle spasms. Patient not taking: Reported on 11/17/2014 03/01/14   Lance Bosch, NP  HYDROcodone-homatropine [facility]) 5-1.5 MG/5ML syrup Take 5 mLs by mouth at bedtime as needed for cough.  11/17/14   Amy Breach, PA-C  ibuprofen (ADVIL,MOTRIN) 600 MG tablet Take 1 tablet (600 mg total) by mouth every 6 (six) hours as needed. 09/08/13   Amy Biles, MD  ibuprofen (ADVIL,MOTRIN) 800 MG tablet Take 1 tablet (800 mg total) by mouth 3 (three) times daily. 02/18/14   Amy M Hess, PA-C  naproxen (NAPROSYN) 500 MG tablet Take 1 tablet (500 mg total) by mouth 2 (two) times daily. 11/17/14   Amy Breach, PA-C  naproxen sodium  (ANAPROX) 220 MG tablet Take 220 mg by mouth 2 (two) times daily as needed. For pain    Historical Provider, MD  predniSONE (DELTASONE) 50 MG tablet Take 1 tablet (50 mg total) by mouth daily. Patient not taking: Reported on 11/17/2014 09/08/13   Amy Biles, MD   BP 148/108 mmHg  Pulse 96  Temp(Src) 98.1 F (36.7 C) (Oral)  Resp 17  SpO2 96%  LMP 10/04/2015   Physical Exam  Constitutional: She appears well-developed and well-nourished. No distress.  HENT:  Head: Normocephalic and atraumatic.  Neck: Neck supple.  Pulmonary/Chest: Effort normal.  Musculoskeletal:       Right knee: She exhibits normal range of motion, no swelling, no effusion, no ecchymosis, no deformity, no laceration, no erythema, normal alignment, no LCL laxity and no MCL laxity.       Legs: RLE: distal pulses intact; sensation intact; calf soft and tedner; no edema; tenderness over superior patella; no laxity of joint; no discoloration; no warmth  Neurological: She is alert.  Skin: She is not diaphoretic.  Nursing note and vitals reviewed.   ED Course  Procedures  DIAGNOSTIC STUDIES:  Oxygen Saturation is 96% on RA, normal by my interpretation.    COORDINATION OF CARE:  4:41 PM Will x-ray right knee. Discussed treatment plan with pt at bedside and pt agreed to plan.  Labs Review Labs Reviewed - No data to display  Imaging Review Dg Knee Complete 4 Views Right  10/18/2015  CLINICAL DATA:  Medial right knee pain and swelling. EXAM: RIGHT KNEE - COMPLETE 4+ VIEW COMPARISON:  09/27/2008 FINDINGS: No evidence of fracture, dislocation, or joint effusion. Minimal osteoarthritic changes of the medial and patellofemoral compartments are seen. Soft tissues are unremarkable. IMPRESSION: No acute fracture or dislocation identified about the right knee. Minimal osteoarthritic changes of the medial and patellofemoral compartments of the knee. Electronically Signed   By: Amy Barajas M.D.   On: 10/18/2015 17:02    I have personally reviewed and evaluated these images and lab results as part of my medical decision-making.   EKG Interpretation None      MDM   Final diagnoses:  Osteoarthritis of right knee, unspecified osteoarthritis type  Right knee pain    Afebrile, nontoxic patient with pain in the right knee x 1 month without injury.  Has osteoarthritis in the knee, also likely has some element of tendonitis.   Xray demonstrates minimal arthritis.   D/C home with mobic, knee sleeve, PCP follow up.   Discussed result, findings, treatment, and follow up  with patient.  Pt given return precautions.  Pt verbalizes understanding and agrees with plan.        I personally performed the services described in this documentation, which was scribed in my presence. The recorded information has been reviewed and is accurate.    Amy Bibles, PA-C 10/18/15 1730  Charlesetta Shanks, MD 10/18/15 [phone]

## 2015-10-18 NOTE — Discharge Instructions (Signed)
Read the information below.  Use the prescribed medication as directed.  Please discuss all new medications with your pharmacist.  You may return to the Emergency Department at any time for worsening condition or any new symptoms that concern you.    If you develop uncontrolled pain, weakness or numbness of the extremity, severe discoloration of the skin, or you are unable to walk or move your knee, return to the ER for a recheck.     Arthritis Arthritis is a term that is commonly used to refer to joint pain or joint disease. There are more than 100 types of arthritis. CAUSES The most common cause of this condition is wear and tear of a joint. Other causes include:  Gout.  Inflammation of a joint.  An infection of a joint.  Sprains and other injuries near the joint.  A drug reaction or allergic reaction. In some cases, the cause may not be known. SYMPTOMS The main symptom of this condition is pain in the joint with movement. Other symptoms include:  Redness, swelling, or stiffness at a joint.  Warmth coming from the joint.  Fever.  Overall feeling of illness. DIAGNOSIS This condition may be diagnosed with a physical exam and tests, including:  Blood tests.  Urine tests.  Imaging tests, such as MRI, X-rays, or a CT scan. Sometimes, fluid is removed from a joint for testing. TREATMENT Treatment for this condition may involve:  Treatment of the cause, if it is known.  Rest.  Raising (elevating) the joint.  Applying cold or hot packs to the joint.  Medicines to improve symptoms and reduce inflammation.  Injections of a steroid such as cortisone into the joint to help reduce pain and inflammation. Depending on the cause of your arthritis, you may need to make lifestyle changes to reduce stress on your joint. These changes may include exercising more and losing weight. HOME CARE INSTRUCTIONS Medicines  Take over-the-counter and prescription medicines only as told by  your health care provider.  Do not take aspirin to relieve pain if gout is suspected. Activities  Rest your joint if told by your health care provider. Rest is important when your disease is active and your joint feels painful, swollen, or stiff.  Avoid activities that make the pain worse. It is important to balance activity with rest.  Exercise your joint regularly with range-of-motion exercises as told by your health care provider. Try doing low-impact exercise, such as:  Swimming.  Water aerobics.  Biking.  Walking. Joint Care  If your joint is swollen, keep it elevated if told by your health care provider.  If your joint feels stiff in the morning, try taking a warm shower.  If directed, apply heat to the joint. If you have diabetes, do not apply heat without permission from your health care provider.  Put a towel between the joint and the hot pack or heating pad.  Leave the heat on the area for 20-30 minutes.  If directed, apply ice to the joint:  Put ice in a plastic bag.  Place a towel between your skin and the bag.  Leave the ice on for 20 minutes, 2-3 times per day.  Keep all follow-up visits as told by your health care provider. This is important. SEEK MEDICAL CARE IF:  The pain gets worse.  You have a fever. SEEK IMMEDIATE MEDICAL CARE IF:  You develop severe joint pain, swelling, or redness.  Many joints become painful and swollen.  You develop severe back  pain.  You develop severe weakness in your leg.  You cannot control your bladder or bowels.   This information is not intended to replace advice given to you by your health care provider. Make sure you discuss any questions you have with your health care provider.   Document Released: 06/26/2004 Document Revised: 02/07/2015 Document Reviewed: 08/14/2014 Elsevier Interactive Patient Education 2016 [employer]  Knee Pain Knee pain is a very common symptom and can have many causes. Knee pain  often goes away when you follow your health care provider's instructions for relieving pain and discomfort at home. However, knee pain can develop into a condition that needs treatment. Some conditions may include:  Arthritis caused by wear and tear (osteoarthritis).  Arthritis caused by swelling and irritation (rheumatoid arthritis or gout).  A cyst or growth in your knee.  An infection in your knee joint.  An injury that will not heal.  Damage, swelling, or irritation of the tissues that support your knee (torn ligaments or tendinitis). If your knee pain continues, additional tests may be ordered to diagnose your condition. Tests may include X-rays or other imaging studies of your knee. You may also need to have fluid removed from your knee. Treatment for ongoing knee pain depends on the cause, but treatment may include:  Medicines to relieve pain or swelling.  Steroid injections in your knee.  Physical therapy.  Surgery. HOME CARE INSTRUCTIONS  Take medicines only as directed by your health care provider.  Rest your knee and keep it raised (elevated) while you are resting.  Do not do things that cause or worsen pain.  Avoid high-impact activities or exercises, such as running, jumping rope, or doing jumping jacks.  Apply ice to the knee area:  Put ice in a plastic bag.  Place a towel between your skin and the bag.  Leave the ice on for 20 minutes, 2-3 times a day.  Ask your health care provider if you should wear an elastic knee support.  Keep a pillow under your knee when you sleep.  Lose weight if you are overweight. Extra weight can put pressure on your knee.  Do not use any tobacco products, including cigarettes, chewing tobacco, or electronic cigarettes. If you need help quitting, ask your health care provider. Smoking may slow the healing of any bone and joint problems that you may have. SEEK MEDICAL CARE IF:  Your knee pain continues, changes, or gets  worse.  You have a fever along with knee pain.  Your knee buckles or locks up.  Your knee becomes more swollen. SEEK IMMEDIATE MEDICAL CARE IF:   Your knee joint feels hot to the touch.  You have chest pain or trouble breathing.   This information is not intended to replace advice given to you by your health care provider. Make sure you discuss any questions you have with your health care provider.   Document Released: 03/16/2007 Document Revised: 06/09/2014 Document Reviewed: 01/02/2014 Elsevier Interactive Patient Education [employer].

## 2015-10-18 NOTE — ED Notes (Signed)
Pt reports R knee pain x 1 month.  Denies injury.  Pt is ambulatory without difficulty.

## 2015-11-19 ENCOUNTER — Emergency Department (HOSPITAL_COMMUNITY): Payer: Self-pay

## 2015-11-19 ENCOUNTER — Encounter (HOSPITAL_COMMUNITY): Payer: Self-pay | Admitting: Emergency Medicine

## 2015-11-19 ENCOUNTER — Emergency Department (HOSPITAL_COMMUNITY)
Admission: EM | Admit: 2015-11-19 | Discharge: 2015-11-19 | Disposition: A | Discharge status: Home / Self Care | Payer: Self-pay | Attending: Emergency Medicine | Admitting: Emergency Medicine

## 2015-11-19 DIAGNOSIS — M25562 Pain in left knee: Secondary | ICD-10-CM

## 2015-11-19 DIAGNOSIS — Z79899 Other long term (current) drug therapy: Secondary | ICD-10-CM | POA: Insufficient documentation

## 2015-11-19 DIAGNOSIS — M1990 Unspecified osteoarthritis, unspecified site: Secondary | ICD-10-CM | POA: Insufficient documentation

## 2015-11-19 DIAGNOSIS — M25462 Effusion, left knee: Secondary | ICD-10-CM

## 2015-11-19 DIAGNOSIS — J45909 Unspecified asthma, uncomplicated: Secondary | ICD-10-CM | POA: Insufficient documentation

## 2015-11-19 DIAGNOSIS — I10 Essential (primary) hypertension: Secondary | ICD-10-CM | POA: Insufficient documentation

## 2015-11-19 DIAGNOSIS — Z7982 Long term (current) use of aspirin: Secondary | ICD-10-CM | POA: Insufficient documentation

## 2015-11-19 MED ORDER — NAPROXEN 500 MG PO TABS
500.0000 mg | ORAL_TABLET | Freq: Once | ORAL | Status: AC
  Administered 2015-11-19: 500 mg via ORAL
  Filled 2015-11-19: qty 1

## 2015-11-19 NOTE — ED Provider Notes (Signed)
CSN: [id number]     Arrival date & time 11/19/15  1300 History  By signing my name below, I, Amy Barajas, attest that this documentation has been prepared under the direction and in the presence of Donnald Garre, PA-C Electronically Signed: Soijett Barajas, ED Scribe. 11/19/2015. 3:12 PM.   Chief Complaint  Patient presents with  . Arthritis      The history is provided by the patient. No language interpreter was used.    Amy Barajas is a 45 y.o. female who presents to the Emergency Department complaining of intermittent bilateral knee pain onset 1 week ago worsening yesterday. Pt reports that she was dx with arthritis last month by her PCP and she is having pain to her bilateral knee pain. Pt states that ambulation worsens her bilateral knee pain. Pt states that she didn't get the mobic filled that was Rx during her last ED visit. Pt is having associated symptoms of bilateral feet swelling x yesterday and gait problem due to pain. She notes that she has tried ibuprofen with relief of her symptoms. She denies color change, wound, rash, and any other symptoms. Pt does have a PCP that she hasn't seen in awhile. Denies ever having a bone density scan.   Past Medical History  Diagnosis Date  . Acid reflux   . Hypertension   . Chronic UTI   . Asthma    Past Surgical History  Procedure Laterality Date  . Cholecystectomy     Family History  Problem Relation Age of Onset  . Diabetes Father    Social History  Substance Use Topics  . Smoking status: Never Smoker   . Smokeless tobacco: Never Used  . Alcohol Use: No   OB History    No data available     Review of Systems  Musculoskeletal: Positive for joint swelling (bilateral feet), arthralgias (bilateral knees), gait problem (due to pain) and arthritis.  Skin: Negative for color change, rash and wound.  All other systems reviewed and are negative.     Allergies  Review of patient's allergies indicates no known  allergies.  Home Medications   Prior to Admission medications   Medication Sig Start Date End Date Taking? Authorizing Provider  acetaminophen (TYLENOL) 500 MG tablet Take 500 mg by mouth 2 (two) times daily as needed for pain.    Historical Provider, MD  albuterol (PROVENTIL HFA;VENTOLIN HFA) 108 (90 BASE) MCG/ACT inhaler Inhale 1-2 puffs into the lungs every 6 (six) hours as needed for wheezing or shortness of breath. 09/08/13   Varney Biles, MD  amLODipine (NORVASC) 5 MG tablet Take 1 tablet (5 mg total) by mouth daily. 11/07/14   Lance Bosch, NP  Aspirin-Acetaminophen-Caffeine (GOODY HEADACHE PO) Take 1 packet by mouth 2 (two) times daily as needed (for pain).    Historical Provider, MD  clotrimazole-betamethasone (LOTRISONE) cream Apply 1 application topically 2 (two) times daily. Patient not taking: Reported on 11/17/2014 11/07/14   Lance Bosch, NP  meloxicam (MOBIC) 7.5 MG tablet Take 1 tablet (7.5 mg total) by mouth daily as needed for pain. 10/18/15   Clayton Bibles, PA-C   BP 152/104 mmHg  Pulse 81  Temp(Src) 98.3 F (36.8 C) (Oral)  Resp 18  Ht 5\' 9"  (1.753 m)  Wt 230 lb 3.2 oz (104.418 kg)  BMI 33.98 kg/m2  SpO2 97% Physical Exam  Constitutional: She is oriented to person, place, and time. She appears well-developed and well-nourished. No distress.  HENT:  Head: Normocephalic and  atraumatic.  Eyes: Conjunctivae are normal. Right eye exhibits no discharge. Left eye exhibits no discharge. No scleral icterus.  Cardiovascular: Normal rate.   Pulmonary/Chest: Effort normal.  Musculoskeletal:       Right knee: She exhibits normal range of motion. Tenderness found.       Left knee: She exhibits normal range of motion. Tenderness found.  Negative anterior/poster drawer bilaterally. Negative ballottement test. No varus or valgus laxity. No crepitus. No pain with flexion or extension. Mild TTP of anterior aspect of bilateral knees. No edema of ankles. No calf tenderness. Nl ROM.    Neurological: She is alert and oriented to person, place, and time. Coordination normal.  Skin: Skin is warm and dry. No rash noted. She is not diaphoretic. No erythema. No pallor.  Psychiatric: She has a normal mood and affect. Her behavior is normal.  Nursing note and vitals reviewed.   ED Course  Procedures (including critical care time) DIAGNOSTIC STUDIES: Oxygen Saturation is 97% on RA, nl by my interpretation.    COORDINATION OF CARE: 3:10 PM Discussed treatment plan with pt at bedside which includes left knee xray, knee sleeve, naprosyn, follow up with PCP, and referral to orthopedist, and pt agreed to plan.    Labs Review Labs Reviewed - No data to display  Imaging Review Dg Knee Complete 4 Views Left  11/19/2015  CLINICAL DATA:  Pain in the left knee.  Unable to stand. EXAM: LEFT KNEE - COMPLETE 4+ VIEW COMPARISON:  None. FINDINGS: There is a joint effusion. No evidence of fracture or dislocation. No degenerative changes or focal findings. IMPRESSION: Joint effusion.  Otherwise negative. Electronically Signed   By: Nelson Chimes M.D.   On: 11/19/2015 15:37   I have personally reviewed and evaluated these images as part of my medical decision-making.   EKG Interpretation None      MDM   Final diagnoses:  Left knee pain  Joint effusion, knee, left    Patient X-Ray negative for obvious fracture or dislocation. There is a large joint effusion of left knee. Doubt septic arthritis. No fever. No erythema or warmth of knee. No pani with flexion or extension of knee. Likely inflammatory. I offered joint aspiration to pt which she declined. I discussed risk and benefits of procedure with pt who expresses complete understand and still declines procedure. Pt will use RICE precautions, take NSAIDS and home mobic and follow up with PCP and orthopedics. Patient given knee sleeve while in ED, conservative therapy recommended and discussed. Advised pt to fill mobic prescription from  last ED visit. Patient will be discharged home & is agreeable with above plan. Returns precautions discussed. Pt appears safe for discharge.   I personally performed the services described in this documentation, which was scribed in my presence. The recorded information has been reviewed and is accurate.     Dondra Spry Perry, PA-C 11/20/15 2026  Sherwood Gambler, MD 11/22/15 [phone]

## 2015-11-19 NOTE — ED Notes (Signed)
Per pt, states B/L knee pain-was diagnosed with arthritis last month-having pain

## 2015-11-19 NOTE — Discharge Instructions (Signed)
Knee Effusion Knee effusion means that you have excess fluid in your knee joint. This can cause pain and swelling in your knee. This may make your knee more difficult to bend and move. That is because there is increased pain and pressure in the joint. If there is fluid in your knee, it often means that something is wrong inside your knee, such as severe arthritis, abnormal inflammation, or an infection. Another common cause of knee effusion is an injury to the knee muscles, ligaments, or cartilage. HOME CARE INSTRUCTIONS  Use crutches as directed by your health care provider.  Wear a knee brace as directed by your health care provider.  Apply ice to the swollen area:  Put ice in a plastic bag.  Place a towel between your skin and the bag.  Leave the ice on for 20 minutes, 2-3 times per day.  Keep your knee raised (elevated) when you are sitting or lying down.  Take medicines only as directed by your health care provider.  Do any rehabilitation or strengthening exercises as directed by your health care provider.  Rest your knee as directed by your health care provider. You may start doing your normal activities again when your health care provider approves.   Keep all follow-up visits as directed by your health care provider. This is important. SEEK MEDICAL CARE IF:  You have ongoing (persistent) pain in your knee. SEEK IMMEDIATE MEDICAL CARE IF:  You have increased swelling or redness of your knee.  You have severe pain in your knee.  You have a fever.   This information is not intended to replace advice given to you by your health care provider. Make sure you discuss any questions you have with your health care provider.  Take ibuprofen and mobic As prescribed. Apply ice to affected area. Wear knee braces while at work. Follow up with your PCP for reevaluation. Return to the ED if you experience increased swelling or redness of your knee, fevers, chills, or if your knee is  hot to touch.

## 2015-11-19 NOTE — ED Notes (Signed)
Patient transported to X-ray 

## 2016-08-05 ENCOUNTER — Emergency Department (HOSPITAL_COMMUNITY)
Admission: EM | Admit: 2016-08-05 | Discharge: 2016-08-05 | Disposition: A | Discharge status: Home / Self Care | Payer: Self-pay | Attending: Emergency Medicine | Admitting: Emergency Medicine

## 2016-08-05 ENCOUNTER — Emergency Department (HOSPITAL_COMMUNITY): Payer: Self-pay

## 2016-08-05 ENCOUNTER — Encounter (HOSPITAL_COMMUNITY): Payer: Self-pay

## 2016-08-05 DIAGNOSIS — Z79899 Other long term (current) drug therapy: Secondary | ICD-10-CM | POA: Insufficient documentation

## 2016-08-05 DIAGNOSIS — Z7982 Long term (current) use of aspirin: Secondary | ICD-10-CM | POA: Insufficient documentation

## 2016-08-05 DIAGNOSIS — M7712 Lateral epicondylitis, left elbow: Secondary | ICD-10-CM

## 2016-08-05 DIAGNOSIS — J45909 Unspecified asthma, uncomplicated: Secondary | ICD-10-CM | POA: Insufficient documentation

## 2016-08-05 DIAGNOSIS — M7711 Lateral epicondylitis, right elbow: Secondary | ICD-10-CM | POA: Insufficient documentation

## 2016-08-05 DIAGNOSIS — I10 Essential (primary) hypertension: Secondary | ICD-10-CM | POA: Insufficient documentation

## 2016-08-05 MED ORDER — NAPROXEN 500 MG PO TABS: 500.0000 mg | ORAL_TABLET | Freq: Two times a day (BID) | ORAL | 0 refills | Status: DC

## 2016-08-05 NOTE — ED Triage Notes (Signed)
Per Pt, Pt is coming from work with complaints of right elbow pain after moving a heavy part. Reports pain last week with moving, but today pt heard a "pop" and has not been able to move arm without pain.

## 2016-08-05 NOTE — Discharge Instructions (Signed)
No lifting over 2 lbs with RUE until pain free, or  cleared by Occupational Health.  Ice for 20 minutes 3 times per day

## 2016-08-05 NOTE — ED Provider Notes (Signed)
[facility] DEPT Provider Note   CSN: [id number] Arrival date & time: 08/05/16  1228  By signing my name below, I, Hansel Feinstein, attest that this documentation has been prepared under the direction and in the presence of Tanna Furry, MD. Electronically Signed: Hansel Feinstein, ED Scribe. 08/05/16. 1:33 PM.     History   Chief Complaint Chief Complaint  Patient presents with  . Arm Pain    HPI Amy Barajas is a 46 y.o. female otherwise healthy who presents to the Emergency Department complaining of moderate right elbow pain s/p heavy lifting last week. 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 Pt reports that today, she was holding a heavy part with both hands and lowered it into a box when she felt a "pop" followed by worsened pain. She denies additional falls, injury or trauma. Pt states her pain is worsened with elbow movement. No alleviating factors noted. She denies additional complaints or injuries.    The history is provided by the patient. No language interpreter was used.    Past Medical History:  Diagnosis Date  . Acid reflux   . Asthma   . Chronic UTI   . Hypertension     Patient Active Problem List   Diagnosis Date Noted  . Essential hypertension 11/08/2014    Past Surgical History:  Procedure Laterality Date  . CESAREAN SECTION    . CHOLECYSTECTOMY    . TUBAL LIGATION      OB History    No data available       Home Medications    Prior to Admission medications   Medication Sig Start Date End Date Taking? Authorizing Provider  acetaminophen (TYLENOL) 500 MG tablet Take 500 mg by mouth 2 (two) times daily as needed for pain.    Historical Provider, MD  albuterol (PROVENTIL HFA;VENTOLIN HFA) 108 (90 BASE) MCG/ACT inhaler Inhale 1-2 puffs into the lungs every 6 (six) hours as needed for wheezing or shortness of breath. 09/08/13   Varney Biles, MD  amLODipine (NORVASC) 5 MG tablet Take 1  tablet (5 mg total) by mouth daily. 11/07/14   Lance Bosch, NP  Aspirin-Acetaminophen-Caffeine (GOODY HEADACHE PO) Take 1 packet by mouth 2 (two) times daily as needed (for pain).    Historical Provider, MD  clotrimazole-betamethasone (LOTRISONE) cream Apply 1 application topically 2 (two) times daily. Patient not taking: Reported on 11/17/2014 11/07/14   Lance Bosch, NP  meloxicam (MOBIC) 7.5 MG tablet Take 1 tablet (7.5 mg total) by mouth daily as needed for pain. 10/18/15   Clayton Bibles, PA-C  naproxen (NAPROSYN) 500 MG tablet Take 1 tablet (500 mg total) by mouth 2 (two) times daily. 08/05/16   Tanna Furry, MD    Family History Family History  Problem Relation Age of Onset  . Diabetes Father     Social History Social History  Substance Use Topics  . Smoking status: Never Smoker  . Smokeless tobacco: Never Used  . Alcohol use No     Allergies   Patient has no known allergies.   Review of Systems Review of Systems  Constitutional: Negative for appetite change, chills, diaphoresis, fatigue and fever.  HENT: Negative for mouth sores, sore throat and trouble swallowing.   Eyes: Negative for visual disturbance.  Respiratory: Negative for cough, chest tightness, shortness of breath and wheezing.   Cardiovascular: Negative for chest pain.  Gastrointestinal: Negative for abdominal distention, abdominal pain, diarrhea,  nausea and vomiting.  Endocrine: Negative for polydipsia, polyphagia and polyuria.  Genitourinary: Negative for dysuria, frequency and hematuria.  Musculoskeletal: Positive for arthralgias. Negative for gait problem.  Skin: Negative for color change, pallor and rash.  Neurological: Negative for dizziness, syncope, light-headedness and headaches.  Hematological: Does not bruise/bleed easily.  Psychiatric/Behavioral: Negative for behavioral problems and confusion.     Physical Exam Updated Vital Signs BP 144/95 (BP Location: Left Arm)   Pulse 80   Temp 98.3 F (36.8  C) (Oral)   Resp 18   Ht 5' 9.5" (1.765 m)   SpO2 100%   Physical Exam  Constitutional: She is oriented to person, place, and time. She appears well-developed and well-nourished. No distress.  HENT:  Head: Normocephalic.  Eyes: Conjunctivae are normal. Pupils are equal, round, and reactive to light. No scleral icterus.  Neck: Normal range of motion. Neck supple. No thyromegaly present.  Cardiovascular: Normal rate and regular rhythm.  Exam reveals no gallop and no friction rub.   No murmur heard. Pulmonary/Chest: Effort normal and breath sounds normal. No respiratory distress. She has no wheezes. She has no rales.  Abdominal: Soft. Bowel sounds are normal. She exhibits no distension. There is no tenderness. There is no rebound.  Musculoskeletal: Normal range of motion.  Pt is points to her pain over the olecranon and lateral epicondyle. Painful to extend wrist. No pain with wrist flexion, or elbow palpation at olecranon, or pain with flex/extend at elbow  Neurological: She is alert and oriented to person, place, and time.  Skin: Skin is warm and dry. No rash noted.  Psychiatric: She has a normal mood and affect. Her behavior is normal.  Nursing note and vitals reviewed.    ED Treatments / Results   DIAGNOSTIC STUDIES: Oxygen Saturation is 100% on RA, normal by my interpretation.    COORDINATION OF CARE: 1:29 PM Discussed treatment plan with pt at bedside which includes XR and pt agreed to plan.    Labs (all labs ordered are listed, but only abnormal results are displayed) Labs Reviewed - No data to display  EKG  EKG Interpretation None       Radiology Dg Elbow Complete Right  Result Date: 08/05/2016 CLINICAL DATA:  Elbow pain, no known injury, initial encounter EXAM: RIGHT ELBOW - COMPLETE 3+ VIEW COMPARISON:  None. FINDINGS: There is no evidence of fracture, dislocation, or joint effusion. There is no evidence of arthropathy or other focal bone abnormality. Soft  tissues are unremarkable. IMPRESSION: No acute abnormality noted. Electronically Signed   By: Inez Catalina M.D.   On: 08/05/2016 13:18    Procedures Procedures (including critical care time)  Medications Ordered in ED Medications - No data to display   Initial Impression / Assessment and Plan / ED Course  I have reviewed the triage vital signs and the nursing notes.  Pertinent labs & imaging results that were available during my care of the patient were reviewed by me and considered in my medical decision making (see chart for details).     Suspect epicondylitis. Patient X-Ray negative for obvious fracture or dislocation.  Pt advised to follow up with occupational therapy. Patient given elbow sleeve while in ED, conservative therapy recommended and discussed, including antiinflammatories. Pt provided with instructions to reduce lifting at work to 2lbs and under for the next week. Patient will be discharged home & is agreeable with above plan. Returns precautions discussed. Pt appears safe for discharge.   Final Clinical Impressions(s) / ED  Diagnoses   Final diagnoses:  Lateral epicondylitis of left elbow    New Prescriptions New Prescriptions   NAPROXEN (NAPROSYN) 500 MG TABLET    Take 1 tablet (500 mg total) by mouth 2 (two) times daily.    Sleeve. Ice. Occupational health. Naproxen.   Tanna Furry, MD 08/05/16 [phone]

## 2016-08-05 NOTE — ED Notes (Signed)
Pt transported to Xray. 

## 2016-12-29 ENCOUNTER — Encounter (HOSPITAL_COMMUNITY): Payer: Self-pay | Admitting: Emergency Medicine

## 2016-12-29 ENCOUNTER — Emergency Department (HOSPITAL_COMMUNITY)
Admission: EM | Admit: 2016-12-29 | Discharge: 2016-12-29 | Disposition: A | Discharge status: Home / Self Care | Payer: Self-pay | Attending: Emergency Medicine | Admitting: Emergency Medicine

## 2016-12-29 ENCOUNTER — Emergency Department (HOSPITAL_COMMUNITY): Payer: Self-pay

## 2016-12-29 DIAGNOSIS — R0789 Other chest pain: Secondary | ICD-10-CM | POA: Insufficient documentation

## 2016-12-29 DIAGNOSIS — R030 Elevated blood-pressure reading, without diagnosis of hypertension: Secondary | ICD-10-CM | POA: Insufficient documentation

## 2016-12-29 DIAGNOSIS — I10 Essential (primary) hypertension: Secondary | ICD-10-CM | POA: Insufficient documentation

## 2016-12-29 DIAGNOSIS — J45909 Unspecified asthma, uncomplicated: Secondary | ICD-10-CM | POA: Insufficient documentation

## 2016-12-29 DIAGNOSIS — R55 Syncope and collapse: Secondary | ICD-10-CM | POA: Insufficient documentation

## 2016-12-29 LAB — I-STAT TROPONIN, ED
Troponin i, poc: 0.01 ng/mL (ref 0.00–0.08)
Troponin i, poc: 0 ng/mL (ref 0.00–0.08)

## 2016-12-29 LAB — BASIC METABOLIC PANEL
Anion gap: 8 (ref 5–15)
BUN: 15 mg/dL (ref 6–20)
CO2: 23 mmol/L (ref 22–32)
Calcium: 8.9 mg/dL (ref 8.9–10.3)
Chloride: 105 mmol/L (ref 101–111)
Creatinine, Ser: 1.14 mg/dL — ABNORMAL HIGH (ref 0.44–1.00)
GFR calc Af Amer: >60 mL/min (ref >60)
GFR calc non Af Amer: 56 mL/min — ABNORMAL LOW (ref >60)
Glucose, Bld: 94 mg/dL (ref 65–99)
Potassium: 5.5 mmol/L — ABNORMAL HIGH (ref 3.5–5.1)
Sodium: 136 mmol/L (ref 135–145)

## 2016-12-29 LAB — CBC
HCT: 34.9 % — ABNORMAL LOW (ref 36.0–46.0)
Hemoglobin: 11.5 g/dL — ABNORMAL LOW (ref 12.0–15.0)
MCH: 26.4 pg (ref 26.0–34.0)
MCHC: 33 g/dL (ref 30.0–36.0)
MCV: 80.2 fL (ref 78.0–100.0)
Platelets: 319 10*3/uL (ref 150–400)
RBC: 4.35 MIL/uL (ref 3.87–5.11)
RDW: 15.4 % (ref 11.5–15.5)
WBC: 9.7 10*3/uL (ref 4.0–10.5)

## 2016-12-29 MED ORDER — SODIUM CHLORIDE 0.9 % IV BOLUS (SEPSIS)
1000.0000 mL | Freq: Once | INTRAVENOUS | Status: AC
  Administered 2016-12-29: 1000 mL via INTRAVENOUS

## 2016-12-29 MED ORDER — KETOROLAC TROMETHAMINE 30 MG/ML IJ SOLN
15.0000 mg | Freq: Once | INTRAMUSCULAR | Status: AC
  Administered 2016-12-29: 15 mg via INTRAVENOUS
  Filled 2016-12-29: qty 1

## 2016-12-29 MED ORDER — AMLODIPINE BESYLATE 5 MG PO TABS: 5.0000 mg | ORAL_TABLET | Freq: Every day | ORAL | 0 refills | Status: DC

## 2016-12-29 NOTE — ED Provider Notes (Signed)
[facility] DEPT Provider Note   CSN: [id number] Arrival date & time: 12/29/16  1010     History   Chief Complaint Chief Complaint  Patient presents with  . Chest Pain  . Weakness  . Near Syncope    HPI Amy Barajas is a 46 y.o. female who presents with near syncope and chest pain. PMH significant for HTN and asthma. She states that she was at work talking to a coworker when she felt very lightheaded like she was going to pass out. Her coworker helped her to a chair and she developed substernal chest pain and left leg and right arm tingling. She denies any fever, recent illness, SOB, cough, abdominal pain, N/V/D, leg swelling. She did not pass out. She is supposed to be on BP meds but has been lost to follow up and has not had any refills. She has previously seen [facility]. She has been eating and drinking well. She works inside, Lawyer.  HPI  Past Medical History:  Diagnosis Date  . Acid reflux   . Asthma   . Chronic UTI   . Hypertension     Patient Active Problem List   Diagnosis Date Noted  . Essential hypertension 11/08/2014    Past Surgical History:  Procedure Laterality Date  . CESAREAN SECTION    . CHOLECYSTECTOMY    . TUBAL LIGATION      OB History    No data available       Home Medications    Prior to Admission medications   Medication Sig Start Date End Date Taking? Authorizing Provider  albuterol (PROVENTIL HFA;VENTOLIN HFA) 108 (90 BASE) MCG/ACT inhaler Inhale 1-2 puffs into the lungs every 6 (six) hours as needed for wheezing or shortness of breath. 09/08/13  Yes Nanavati, Ankit, MD  amLODipine (NORVASC) 5 MG tablet Take 1 tablet (5 mg total) by mouth daily. Patient not taking: Reported on 12/29/2016 11/07/14   Lance Bosch, NP  meloxicam (MOBIC) 7.5 MG tablet Take 1 tablet (7.5 mg total) by mouth daily as needed for pain. Patient not taking: Reported on 12/29/2016 10/18/15   Clayton Bibles, PA-C  naproxen  (NAPROSYN) 500 MG tablet Take 1 tablet (500 mg total) by mouth 2 (two) times daily. Patient not taking: Reported on 12/29/2016 08/05/16   Tanna Furry, MD    Family History Family History  Problem Relation Age of Onset  . Diabetes Father     Social History Social History  Substance Use Topics  . Smoking status: Never Smoker  . Smokeless tobacco: Never Used  . Alcohol use No     Allergies   Patient has no known allergies.   Review of Systems Review of Systems  Constitutional: Negative for fever.  Respiratory: Negative for shortness of breath.   Cardiovascular: Positive for chest pain and palpitations. Negative for leg swelling.  Gastrointestinal: Negative for abdominal pain, nausea and vomiting.  Neurological: Positive for light-headedness. Negative for syncope.  All other systems reviewed and are negative.    Physical Exam Updated Vital Signs BP (!) 131/102   Pulse 76   Temp 98.3 F (36.8 C) (Oral)   Resp 15   Ht 5' 9.5" (1.765 m)   Wt 107 kg (235 lb 14.4 oz)   SpO2 100%   BMI 34.34 kg/m   Physical Exam  Constitutional: She is oriented to person, place, and time. She appears well-developed and well-nourished. No distress.  HENT:  Head: Normocephalic and atraumatic.  Eyes: Pupils are equal, round, and reactive to light. Conjunctivae are normal. Right eye exhibits no discharge. Left eye exhibits no discharge. No scleral icterus.  Neck: Normal range of motion.  Cardiovascular: Normal rate and regular rhythm.  Exam reveals no gallop and no friction rub.   No murmur heard. Pulmonary/Chest: Effort normal and breath sounds normal. No respiratory distress. She has no wheezes. She has no rales. She exhibits tenderness (sternal tenderness).  Abdominal: Soft. Bowel sounds are normal. She exhibits no distension and no mass. There is no tenderness. There is no rebound and no guarding. No hernia.  Neurological: She is alert and oriented to person, place, and time.  Skin: Skin  is warm and dry.  Psychiatric: She has a normal mood and affect. Her behavior is normal.  Nursing note and vitals reviewed.    ED Treatments / Results  Labs (all labs ordered are listed, but only abnormal results are displayed) Labs Reviewed  BASIC METABOLIC PANEL - Abnormal; Notable for the following:       Result Value   Potassium 5.5 (*)    Creatinine, Ser 1.14 (*)    GFR calc non Af Amer 56 (*)    All other components within normal limits  CBC - Abnormal; Notable for the following:    Hemoglobin 11.5 (*)    HCT 34.9 (*)    All other components within normal limits  I-STAT TROPONIN, ED  I-STAT TROPONIN, ED    EKG  EKG Interpretation  Date/Time:  Monday December 29 2016 10:17:21 EDT Ventricular Rate:  79 PR Interval:  164 QRS Duration: 74 QT Interval:  398 QTC Calculation: 456 R Axis:   31 Text Interpretation:  Normal sinus rhythm t wave abnormalities noted to varying degree on prior tracings overall similar to previous EKG from 08/2013 Confirmed by Virgel Manifold [phone]) on 12/29/2016 10:54:32 AM Also confirmed by Virgel Manifold [phone]), editor Laurena Spies [phone])  on 12/29/2016 11:21:52 AM       Radiology Dg Chest 2 View  Result Date: 12/29/2016 CLINICAL DATA:  Chest pain and shortness of breath EXAM: CHEST  2 VIEW COMPARISON:  September 07, 2013 FINDINGS: The lungs are clear. The heart size and pulmonary vascularity are normal. No adenopathy. No pneumothorax. No bone lesions. IMPRESSION: No edema or consolidation. Electronically Signed   By: Lowella Grip III M.D.   On: 12/29/2016 11:37    Procedures Procedures (including critical care time)  Medications Ordered in ED Medications  sodium chloride 0.9 % bolus 1,000 mL (0 mLs Intravenous Stopped 12/29/16 1502)  ketorolac (TORADOL) 30 MG/ML injection 15 mg (15 mg Intravenous Given 12/29/16 1404)     Initial Impression / Assessment and Plan / ED Course  I have reviewed the triage vital signs and the nursing  notes.  Pertinent labs & imaging results that were available during my care of the patient were reviewed by me and considered in my medical decision making (see chart for details).  46 year old female with near syncope and chest pain. Chest pain work up is reassuring. Doubt ACS, PE, pericarditis, esophageal rupture, tension pneumothorax, aortic dissection, cardiac tamponade. EKG is NSR and shows no significant change since last. CXR is negative. Initial and second troponin is 0. Labs are remarkable for hyperkalemia (5.5) and mildly elevated SCr (1.14). PERC negative. Orthostatics positive (sitting SBP was 140s and standing was 118 and she reported subjective lightheadedness.) 1L IVF given. On recheck, she is feeling better. She ambulated to the bathroom without difficulty.  Will refill her Amlodipine and advised her to f/u with [facility]. She verbalized understanding.   Final Clinical Impressions(s) / ED Diagnoses   Final diagnoses:  Chest wall pain  Near syncope  Elevated blood pressure reading    New Prescriptions New Prescriptions   No medications on file     Iris Pert 12/29/16 Yevette Edwards    Virgel Manifold, MD 01/04/17 [phone]

## 2016-12-29 NOTE — ED Triage Notes (Signed)
Pt. Stated, I was at work and was talking and almost went down, I don't know what happen, but I have some numbness and chest pain. And I came over here.

## 2016-12-29 NOTE — ED Notes (Addendum)
Pt stated that she felt "a little dizzy", when she sat and stood up.  Informed Claiborne Billings - PA.

## 2016-12-29 NOTE — ED Notes (Signed)
Got patient into a gown on the monitor patient is resting with family at beside

## 2016-12-29 NOTE — Discharge Instructions (Signed)
Please make an appointment with [facility] and [facility] to follow up regarding your visit today and high blood pressure Take Amlodipine once a day Return for worsening symptoms

## 2017-01-01 ENCOUNTER — Encounter: Payer: Self-pay | Admitting: Physician Assistant

## 2017-01-01 ENCOUNTER — Ambulatory Visit: Payer: Self-pay | Attending: Internal Medicine | Admitting: Physician Assistant

## 2017-01-01 VITALS — BP 135/92 | HR 91 | Temp 97.8°F | Resp 18 | Ht 69.0 in | Wt 235.2 lb

## 2017-01-01 DIAGNOSIS — Z87440 Personal history of urinary (tract) infections: Secondary | ICD-10-CM | POA: Insufficient documentation

## 2017-01-01 DIAGNOSIS — I10 Essential (primary) hypertension: Secondary | ICD-10-CM

## 2017-01-01 DIAGNOSIS — E875 Hyperkalemia: Secondary | ICD-10-CM

## 2017-01-01 DIAGNOSIS — K219 Gastro-esophageal reflux disease without esophagitis: Secondary | ICD-10-CM | POA: Insufficient documentation

## 2017-01-01 DIAGNOSIS — J45909 Unspecified asthma, uncomplicated: Secondary | ICD-10-CM

## 2017-01-01 MED ORDER — AMLODIPINE BESYLATE 5 MG PO TABS: 5.0000 mg | ORAL_TABLET | Freq: Every day | ORAL | 5 refills | Status: DC

## 2017-01-01 MED ORDER — CLOTRIMAZOLE-BETAMETHASONE 1-0.05 % EX LOTN: TOPICAL_LOTION | Freq: Two times a day (BID) | CUTANEOUS | 2 refills | Status: DC

## 2017-01-01 MED ORDER — ALBUTEROL SULFATE HFA 108 (90 BASE) MCG/ACT IN AERS: 1.0000 | INHALATION_SPRAY | Freq: Four times a day (QID) | RESPIRATORY_TRACT | 0 refills | Status: DC | PRN

## 2017-01-01 NOTE — Patient Instructions (Signed)
Check blood pressure 3-5 times daily and record and bring to next visit 

## 2017-01-01 NOTE — Progress Notes (Signed)
Patient ID: Amy Barajas, female   DOB: 04-24-1971, 46 y.o.   MRN: [id number]    Amy Barajas, is a 46 y.o. female  JSH:[id number]  [id number]:[id number]  DOB - 1971-05-10  Subjective:  Chief Complaint and HPI: Amy Barajas is a 46 y.o. female here today to establish care and for a follow up visit after being seen in the ED 12/29/2016 for near syncope and CP.  EKG did not show ischemia.  Enzymes negative.  No further CP.  PMH: asthma and htn.  Needs albuterol RF.  Previously a patient here but hasn't been seen here in > 26yrs.   Per ED note:  She states that she was at work talking to a coworker when she felt very lightheaded like she was going to pass out. Her coworker helped her to a chair and she developed substernal chest pain and left leg and right arm tingling. She denies any fever, recent illness, SOB, cough, abdominal pain, N/V/D, leg swelling. She did not pass out. She is supposed to be on BP meds but has been lost to follow up and has not had any refills.  Chest pain work up is reassuring. Doubt ACS, PE, pericarditis, esophageal rupture, tension pneumothorax, aortic dissection, cardiac tamponade. EKG is NSR and shows no significant change since last. CXR is negative. Initial and second troponin is 0. Labs are remarkable for hyperkalemia (5.5) and mildly elevated SCr (1.14). PERC negative. Orthostatics positive (sitting SBP was 140s and standing was 118 and she reported subjective lightheadedness.) 1L IVF given. On recheck, she is feeling better. She ambulated to the bathroom without difficulty. Will refill her Amlodipine and advised her to f/u with [facility].  Marland Kitchen   ED/Hospital notes reviewed.    ROS:   Constitutional:  No f/c, No night sweats, No unexplained weight loss. EENT:  No vision changes, No blurry vision, No hearing changes. No mouth, throat, or ear problems.  Respiratory: No cough, No SOB Cardiac: No CP since day seen in ED, no palpitations GI:  No  abd pain, No N/V/D. GU: No Urinary s/sx Musculoskeletal: No joint pain Neuro: No headache, no dizziness, no motor weakness.  Skin: No rash Endocrine:  No polydipsia. No polyuria.  Psych: Denies SI/HI  No problems updated.  ALLERGIES: No Known Allergies  PAST MEDICAL HISTORY: Past Medical History:  Diagnosis Date  . Acid reflux   . Asthma   . Chronic UTI   . Hypertension     MEDICATIONS AT HOME: Prior to Admission medications   Medication Sig Start Date End Date Taking? Authorizing Provider  albuterol (PROVENTIL HFA;VENTOLIN HFA) 108 (90 Base) MCG/ACT inhaler Inhale 1-2 puffs into the lungs every 6 (six) hours as needed for wheezing or shortness of breath. 01/01/17  Yes Freeman Caldron M, PA-C  amLODipine (NORVASC) 5 MG tablet Take 1 tablet (5 mg total) by mouth daily. 01/01/17  Yes Freeman Caldron M, PA-C  clotrimazole-betamethasone (LOTRISONE) lotion Apply topically 2 (two) times daily. 01/01/17   Argentina Donovan, PA-C     Objective:  EXAM:   Vitals:   01/01/17 1336  BP: (!) 135/92  Pulse: 91  Resp: 18  Temp: 97.8 F (36.6 C)  TempSrc: Oral  SpO2: 97%  Weight: 235 lb 3.2 oz (106.7 kg)  Height: 5\' 9"  (1.753 m)    General appearance : A&OX3. NAD. Non-toxic-appearing HEENT: Atraumatic and Normocephalic.  PERRLA. EOM intact.  Neck: supple, no JVD. No cervical lymphadenopathy. No thyromegaly Chest/Lungs:  Breathing-non-labored,  Good air entry bilaterally, breath sounds normal without rales, rhonchi, or wheezing  CVS: S1 S2 regular, no murmurs, gallops, rubs  Extremities: Bilateral Lower Ext shows no edema, both legs are warm to touch with = pulse throughout Neurology:  CN II-XII grossly intact, Non focal.   Psych:  TP linear. J/I WNL. Normal speech. Appropriate eye contact and affect.  Skin:  No Rash  Data Review No results found for: HGBA1C   Assessment & Plan   1. Essential hypertension Uncontrolled but just re-started her meds- continue- amLODipine  (NORVASC) 5 MG tablet; Take 1 tablet (5 mg total) by mouth daily.  Dispense: 30 tablet; Refill: 5 She obtained a monitor for home use-check BP 3-5 times/week and record and bring to next visit.    2. Hyperkalemia at ED.  Likely secondary to dehydration Increase water intake - Basic metabolic panel  3. Mild asthma without complication, unspecified whether persistent - albuterol (PROVENTIL HFA;VENTOLIN HFA) 108 (90 Base) MCG/ACT inhaler; Inhale 1-2 puffs into the lungs every 6 (six) hours as needed for wheezing or shortness of breath.  Dispense: 1 Inhaler; Refill: 0  Patient have been counseled extensively about nutrition and exercise  Return in about 6 weeks (around 02/12/2017) for assign PCP; f/up htn.  The patient was given clear instructions to go to ER or return to medical center if symptoms don't improve, worsen or new problems develop. The patient verbalized understanding. The patient was told to call to get lab results if they haven't heard anything in the next week.   Freeman Caldron, PA-C [facility] and [facility] [place], [place]   01/01/2017, 1:50 PM

## 2017-01-02 LAB — BASIC METABOLIC PANEL
BUN/Creatinine Ratio: 9 (ref 9–23)
BUN: 11 mg/dL (ref 6–24)
CO2: 21 mmol/L (ref 20–29)
CREATININE: 1.23 mg/dL — AB (ref 0.57–1.00)
Calcium: 9.4 mg/dL (ref 8.7–10.2)
Chloride: 106 mmol/L (ref 96–106)
GFR calc Af Amer: 60 mL/min/{1.73_m2} (ref >59)
GFR calc non Af Amer: 52 mL/min/{1.73_m2} — ABNORMAL LOW (ref >59)
GLUCOSE: 99 mg/dL (ref 65–99)
Potassium: 4.3 mmol/L (ref 3.5–5.2)
Sodium: 142 mmol/L (ref 134–144)

## 2017-01-09 ENCOUNTER — Telehealth: Payer: Self-pay

## 2017-01-09 ENCOUNTER — Ambulatory Visit: Payer: Self-pay

## 2017-01-09 NOTE — Telephone Encounter (Signed)
Contacted pt to go over lab results pt is aware of results and doesn't have any questions or concerns 

## 2017-02-04 ENCOUNTER — Encounter (HOSPITAL_COMMUNITY): Payer: Self-pay | Admitting: Emergency Medicine

## 2017-02-04 ENCOUNTER — Ambulatory Visit (HOSPITAL_COMMUNITY)
Admission: EM | Admit: 2017-02-04 | Discharge: 2017-02-04 | Disposition: A | Discharge status: Home / Self Care | Payer: Self-pay | Attending: Family Medicine | Admitting: Family Medicine

## 2017-02-04 DIAGNOSIS — S39012A Strain of muscle, fascia and tendon of lower back, initial encounter: Secondary | ICD-10-CM

## 2017-02-04 MED ORDER — DICLOFENAC SODIUM 75 MG PO TBEC: 75.0000 mg | DELAYED_RELEASE_TABLET | Freq: Two times a day (BID) | ORAL | 0 refills | Status: DC

## 2017-02-04 MED ORDER — CYCLOBENZAPRINE HCL 10 MG PO TABS: 10.0000 mg | ORAL_TABLET | Freq: Two times a day (BID) | ORAL | 0 refills | Status: DC | PRN

## 2017-02-04 MED ORDER — AZITHROMYCIN 250 MG PO TABS: 250.0000 mg | ORAL_TABLET | Freq: Every day | ORAL | 0 refills | Status: DC

## 2017-02-04 MED ORDER — BENZONATATE 100 MG PO CAPS: 100.0000 mg | ORAL_CAPSULE | Freq: Three times a day (TID) | ORAL | 0 refills | Status: DC

## 2017-02-04 NOTE — ED Provider Notes (Signed)
[place]   [id number] 02/04/17 Arrival Time: 1809   SUBJECTIVE:  Amy Barajas is a 46 y.o. female who presents to the urgent care with 2 chief complaints. The first is lower back pain, ongoing for 1 week, denies any injury, however she does work assembling products, and does lift and twist. No unexpected weight loss, night sweats, numbness in her extremities, or loss of control of bowel or bladder function.  Second complaint 3 week history of cough, does have a past history of asthma, has not had to use her inhalers, does not smoke, cough is worse at night, described as dry, hacking, nonproductive, she has not seen her sputum to describe. No nausea, vomiting, fevers, chills, or other symptoms     Past Medical History:  Diagnosis Date  . Acid reflux   . Asthma   . Chronic UTI   . Hypertension    Social History   Social History  . Marital status: Married    Spouse name: N/A  . Number of children: N/A  . Years of education: N/A   Occupational History  . Not on file.   Social History Main Topics  . Smoking status: Never Smoker  . Smokeless tobacco: Never Used  . Alcohol use No  . Drug use: No  . Sexual activity: Not on file   Other Topics Concern  . Not on file   Social History Narrative  . No narrative on file   Current Meds  Medication Sig  . albuterol (PROVENTIL HFA;VENTOLIN HFA) 108 (90 Base) MCG/ACT inhaler Inhale 1-2 puffs into the lungs every 6 (six) hours as needed for wheezing or shortness of breath.  Marland Kitchen amLODipine (NORVASC) 5 MG tablet Take 1 tablet (5 mg total) by mouth daily.   No Known Allergies    ROS: As per HPI, remainder of ROS negative.   OBJECTIVE:  Vitals:   02/04/17 1819  BP: (!) 170/104  Pulse: 98  Resp: 20  Temp: 98.4 F (36.9 C)  TempSrc: Oral  SpO2: 98%       General Appearance:  awake, alert, oriented, in no acute distress, well developed, well nourished and in no acute distress Skin:  skin color,  texture, turgor are normal Head/face:  NCAT Ears:  TM- normal, Canal- normal and External- normal Nose/Sinuses:  Nares normal. Septum midline. Mucosa normal. No drainage or sinus tenderness. Mouth/Throat:  Mucosa moist, no lesions; pharynx without erythema, edema or exudate. Back:  mild pain to palpation in the LS spine midline Lungs:  Normal expansion.  Clear to auscultation.  No rales, rhonchi, or wheezing. Heart:  Heart regular rate and rhythm Abdomen:  Soft and nontender Extremities: Extremities warm to touch, pink, with no edema. Peripheral Pulses:  Capillary refill <2secs, strong peripheral pulses Neurologic:  Alert and oriented     Labs:  Labs Reviewed - No data to display  No results found.     ASSESSMENT & PLAN:  1. Strain of lumbar region, initial encounter     Meds ordered this encounter  Medications  . diclofenac (VOLTAREN) 75 MG EC tablet    Sig: Take 1 tablet (75 mg total) by mouth 2 (two) times daily.    Dispense:  20 tablet    Refill:  0    Order Specific Question:   Supervising Provider    Answer:   Vanessa Kick [id number]  . cyclobenzaprine (FLEXERIL) 10 MG tablet    Sig: Take 1 tablet (10 mg total) by mouth 2 (two)  times daily as needed for muscle spasms.    Dispense:  20 tablet    Refill:  0    Order Specific Question:   Supervising Provider    Answer:   Vanessa Kick [id number]  . azithromycin (ZITHROMAX) 250 MG tablet    Sig: Take 1 tablet (250 mg total) by mouth daily. Take first 2 tablets together, then 1 every day until finished.    Dispense:  6 tablet    Refill:  0    Order Specific Question:   Supervising Provider    Answer:   Vanessa Kick [id number]  . benzonatate (TESSALON) 100 MG capsule    Sig: Take 1 capsule (100 mg total) by mouth every 8 (eight) hours.    Dispense:  21 capsule    Refill:  0    Order Specific Question:   Supervising Provider    Answer:   Vanessa Kick [[id number]]      Reviewed expectations re: course of current  medical issues. Questions answered. Outlined signs and symptoms indicating need for more acute intervention. Patient verbalized understanding. After Visit Summary given.    Procedures:        Barnet Glasgow, NP 02/04/17 1907

## 2017-02-04 NOTE — ED Triage Notes (Signed)
The patient presented to the [facility] with a complaint of a cough x 3 weeks and lower back pain x 1 week. The patient denied any urinary symptoms.

## 2017-02-04 NOTE — Discharge Instructions (Signed)
You most likely have a strained muscle in your lower back. I have prescribed two medicines for your pain. The first is diclofenac, take 1 tablet twice a day and the other is Flexeril, take 1 tablet twice a day. Flexeril may cause drowsiness so do not drive until you know how this medicine affects you. Also do not drink any alcohol either. You may apply ice and alternate with heat for 15 minutes at a time 4 times daily and for additional pain control you may take tylenol over the counter ever 4 hours but do not take more than 4000 mg a day. Should your pain continue or fail to resolve, follow up with your primary care provider or return to clinic as needed.   I am treating you for bronchitis. I have prescribed Azithromycin. Take 2 tablets today, then 1 tablet daily till finished. For cough, I have prescribed a medication called Tessalon. Take 1 tablet every 8 hours as needed for your cough. Should your symptoms fail to improve or worsen, follow up with your primary care provider, or return to clinic.

## 2017-02-12 ENCOUNTER — Ambulatory Visit: Payer: Self-pay | Admitting: Family Medicine

## 2017-09-13 ENCOUNTER — Encounter (HOSPITAL_COMMUNITY): Payer: Self-pay | Admitting: Emergency Medicine

## 2017-09-13 ENCOUNTER — Emergency Department (HOSPITAL_COMMUNITY)
Admission: EM | Admit: 2017-09-13 | Discharge: 2017-09-13 | Disposition: A | Discharge status: Home / Self Care | Payer: Self-pay | Attending: Emergency Medicine | Admitting: Emergency Medicine

## 2017-09-13 DIAGNOSIS — Z79899 Other long term (current) drug therapy: Secondary | ICD-10-CM | POA: Insufficient documentation

## 2017-09-13 DIAGNOSIS — J45909 Unspecified asthma, uncomplicated: Secondary | ICD-10-CM | POA: Insufficient documentation

## 2017-09-13 DIAGNOSIS — M545 Low back pain, unspecified: Secondary | ICD-10-CM

## 2017-09-13 DIAGNOSIS — I10 Essential (primary) hypertension: Secondary | ICD-10-CM | POA: Insufficient documentation

## 2017-09-13 DIAGNOSIS — N3000 Acute cystitis without hematuria: Secondary | ICD-10-CM | POA: Insufficient documentation

## 2017-09-13 LAB — URINALYSIS, ROUTINE W REFLEX MICROSCOPIC
Bilirubin Urine: NEGATIVE
GLUCOSE, UA: NEGATIVE mg/dL
Ketones, ur: NEGATIVE mg/dL
NITRITE: NEGATIVE
PROTEIN: 100 mg/dL — AB
SPECIFIC GRAVITY, URINE: 1.01 (ref 1.005–1.030)
pH: 5 (ref 5.0–8.0)

## 2017-09-13 LAB — CBC WITH DIFFERENTIAL/PLATELET
BASOS PCT: 0 %
Basophils Absolute: 0 10*3/uL (ref 0.0–0.1)
EOS ABS: 0.3 10*3/uL (ref 0.0–0.7)
Eosinophils Relative: 2 %
HCT: 33.5 % — ABNORMAL LOW (ref 36.0–46.0)
HEMOGLOBIN: 11 g/dL — AB (ref 12.0–15.0)
Lymphocytes Relative: 38 %
Lymphs Abs: 4.7 10*3/uL — ABNORMAL HIGH (ref 0.7–4.0)
MCH: 26.7 pg (ref 26.0–34.0)
MCHC: 32.8 g/dL (ref 30.0–36.0)
MCV: 81.3 fL (ref 78.0–100.0)
Monocytes Absolute: 0.4 10*3/uL (ref 0.1–1.0)
Monocytes Relative: 3 %
NEUTROS ABS: 7 10*3/uL (ref 1.7–7.7)
NEUTROS PCT: 57 %
Platelets: 382 10*3/uL (ref 150–400)
RBC: 4.12 MIL/uL (ref 3.87–5.11)
RDW: 15.5 % (ref 11.5–15.5)
WBC: 12.4 10*3/uL — AB (ref 4.0–10.5)

## 2017-09-13 LAB — BASIC METABOLIC PANEL
ANION GAP: 9 (ref 5–15)
BUN: 10 mg/dL (ref 6–20)
CALCIUM: 9.1 mg/dL (ref 8.9–10.3)
CO2: 26 mmol/L (ref 22–32)
CREATININE: 1.23 mg/dL — AB (ref 0.44–1.00)
Chloride: 103 mmol/L (ref 101–111)
GFR calc non Af Amer: 51 mL/min — ABNORMAL LOW (ref >60)
GFR, EST AFRICAN AMERICAN: 59 mL/min — AB (ref >60)
Glucose, Bld: 99 mg/dL (ref 65–99)
Potassium: 3.4 mmol/L — ABNORMAL LOW (ref 3.5–5.1)
SODIUM: 138 mmol/L (ref 135–145)

## 2017-09-13 LAB — I-STAT BETA HCG BLOOD, ED (MC, WL, AP ONLY)

## 2017-09-13 MED ORDER — MENTHOL (TOPICAL ANALGESIC) 7.5 % EX CREA: 1.0000 "application " | TOPICAL_CREAM | Freq: Two times a day (BID) | CUTANEOUS | 0 refills | Status: DC | PRN

## 2017-09-13 MED ORDER — NAPROXEN 500 MG PO TABS: 500.0000 mg | ORAL_TABLET | Freq: Two times a day (BID) | ORAL | 0 refills | Status: DC

## 2017-09-13 MED ORDER — METHOCARBAMOL 500 MG PO TABS: 500.0000 mg | ORAL_TABLET | Freq: Every evening | ORAL | 0 refills | Status: DC | PRN

## 2017-09-13 MED ORDER — CEPHALEXIN 500 MG PO CAPS: 500.0000 mg | ORAL_CAPSULE | Freq: Two times a day (BID) | ORAL | 0 refills | Status: AC

## 2017-09-13 MED ORDER — AMLODIPINE BESYLATE 5 MG PO TABS: 5.0000 mg | ORAL_TABLET | Freq: Every day | ORAL | 5 refills | Status: DC

## 2017-09-13 MED ORDER — SODIUM CHLORIDE 0.9 % IV BOLUS
500.0000 mL | Freq: Once | INTRAVENOUS | Status: AC
  Administered 2017-09-13: 500 mL via INTRAVENOUS

## 2017-09-13 MED ORDER — IBUPROFEN 400 MG PO TABS
400.0000 mg | ORAL_TABLET | Freq: Once | ORAL | Status: AC | PRN
  Administered 2017-09-13: 400 mg via ORAL
  Filled 2017-09-13: qty 1

## 2017-09-13 NOTE — ED Notes (Signed)
Pt understood dc material. NAD noted. Scripts given at dc 

## 2017-09-13 NOTE — ED Provider Notes (Signed)
[facility] EMERGENCY DEPARTMENT Provider Note   CSN: [id number] Arrival date & time: 09/13/17  1812     History   Chief Complaint No chief complaint on file.   HPI Amy Barajas is a 47 y.o. female with past medical history significant for hypertension presenting with 3 days of bilateral lower back pain non-radiating.  She denies any injury or trauma.  Symptoms, fever or chills, history of IV drug use, malignancy.  She reports night sweats related to menopause.  No unexplained weight loss.  She reports episodic tingling in her lower extremities bilaterally but not at this time.  She denies any urinary symptoms.  She has tried Flexeril without relief.  HPI  Past Medical History:  Diagnosis Date  . Acid reflux   . Asthma   . Chronic UTI   . Hypertension     Patient Active Problem List   Diagnosis Date Noted  . Essential hypertension 11/08/2014    Past Surgical History:  Procedure Laterality Date  . CESAREAN SECTION    . CHOLECYSTECTOMY    . TUBAL LIGATION       OB History   None      Home Medications    Prior to Admission medications   Medication Sig Start Date End Date Taking? Authorizing Provider  albuterol (PROVENTIL HFA;VENTOLIN HFA) 108 (90 Base) MCG/ACT inhaler Inhale 1-2 puffs into the lungs every 6 (six) hours as needed for wheezing or shortness of breath. 01/01/17  Yes Freeman Caldron M, PA-C  cyclobenzaprine (FLEXERIL) 10 MG tablet Take 1 tablet (10 mg total) by mouth 2 (two) times daily as needed for muscle spasms. 02/04/17  Yes Barnet Glasgow, NP  amLODipine (NORVASC) 5 MG tablet Take 1 tablet (5 mg total) by mouth daily. 09/13/17   Emeline General, PA-C  azithromycin (ZITHROMAX) 250 MG tablet Take 1 tablet (250 mg total) by mouth daily. Take first 2 tablets together, then 1 every day until finished. Patient not taking: Reported on 09/13/2017 02/04/17   Barnet Glasgow, NP  benzonatate (TESSALON) 100 MG capsule Take 1  capsule (100 mg total) by mouth every 8 (eight) hours. Patient not taking: Reported on 09/13/2017 02/04/17   Barnet Glasgow, NP  cephALEXin (KEFLEX) 500 MG capsule Take 1 capsule (500 mg total) by mouth 2 (two) times daily for 7 days. 09/13/17 09/20/17  Emeline General, PA-C  diclofenac (VOLTAREN) 75 MG EC tablet Take 1 tablet (75 mg total) by mouth 2 (two) times daily. 02/04/17   Barnet Glasgow, NP  Menthol, Topical Analgesic, (ICY HOT NATURALS) 7.5 % CREA Apply 1 application topically 2 (two) times daily as needed. 09/13/17   Emeline General, PA-C  methocarbamol (ROBAXIN) 500 MG tablet Take 1 tablet (500 mg total) by mouth at bedtime as needed. 09/13/17   Avie Echevaria B, PA-C  naproxen (NAPROSYN) 500 MG tablet Take 1 tablet (500 mg total) by mouth 2 (two) times daily. 09/13/17   Emeline General, PA-C    Family History Family History  Problem Relation Age of Onset  . Diabetes Father     Social History Social History   Tobacco Use  . Smoking status: Never Smoker  . Smokeless tobacco: Never Used  Substance Use Topics  . Alcohol use: No  . Drug use: No     Allergies   Patient has no known allergies.   Review of Systems Review of Systems  Constitutional: Negative for chills, diaphoresis, fatigue, fever and unexpected weight change.  Respiratory:  Negative for shortness of breath.   Cardiovascular: Negative for chest pain.  Gastrointestinal: Negative for abdominal distention, abdominal pain, nausea and vomiting.  Genitourinary: Negative for difficulty urinating, dysuria, flank pain, frequency and hematuria.  Musculoskeletal: Positive for back pain and myalgias. Negative for arthralgias, gait problem, joint swelling, neck pain and neck stiffness.  Skin: Negative for color change, pallor and rash.  Neurological: Negative for weakness and numbness.       States that sometimes she feels like her legs are weak generally and some tingling bilaterally but not at this time.       Physical Exam Updated Vital Signs BP (!) 146/96   Pulse 81   Temp 97.8 F (36.6 C) (Oral)   Resp 13   Ht 5' 9.5" (1.765 m)   SpO2 100%   BMI 34.24 kg/m   Physical Exam  Constitutional: She is oriented to person, place, and time. She appears well-developed and well-nourished. No distress.  Afebrile, nontoxic-appearing, sitting comfortably in bed no acute distress.  HENT:  Head: Normocephalic and atraumatic.  Cardiovascular: Normal rate, regular rhythm, normal heart sounds and intact distal pulses. Exam reveals no friction rub.  No murmur heard. Pulmonary/Chest: Effort normal and breath sounds normal. No stridor. No respiratory distress. She has no wheezes. She has no rales.  Abdominal: Soft. She exhibits no distension and no mass. There is no tenderness. There is no guarding.  No CVA tenderness.  Musculoskeletal: Normal range of motion. She exhibits tenderness. She exhibits no edema or deformity.  Tender to palpation of the lower back musculature.  No midline tenderness  Neurological: She is alert and oriented to person, place, and time. No sensory deficit. She exhibits normal muscle tone.  5/5 strength in lower extremities bilaterally.  Sensation intact. strong dorsalis pedis pulses, neurovascularly intact.  Skin: Skin is warm and dry. No rash noted. She is not diaphoretic. No erythema. No pallor.  Psychiatric: She has a normal mood and affect.  Nursing note and vitals reviewed.    ED Treatments / Results  Labs (all labs ordered are listed, but only abnormal results are displayed) Labs Reviewed  URINALYSIS, ROUTINE W REFLEX MICROSCOPIC - Abnormal; Notable for the following components:      Result Value   APPearance HAZY (*)    Hgb urine dipstick SMALL (*)    Protein, ur 100 (*)    Leukocytes, UA SMALL (*)    Bacteria, UA RARE (*)    Squamous Epithelial / LPF 6-30 (*)    All other components within normal limits  BASIC METABOLIC PANEL - Abnormal; Notable for the  following components:   Potassium 3.4 (*)    Creatinine, Ser 1.23 (*)    GFR calc non Af Amer 51 (*)    GFR calc Af Amer 59 (*)    All other components within normal limits  CBC WITH DIFFERENTIAL/PLATELET - Abnormal; Notable for the following components:   WBC 12.4 (*)    Hemoglobin 11.0 (*)    HCT 33.5 (*)    Lymphs Abs 4.7 (*)    All other components within normal limits  URINE CULTURE  I-STAT BETA HCG BLOOD, ED ([facility], [facility], [facility] ONLY)    EKG None  Radiology No results found.  Procedures Procedures (including critical care time)  Medications Ordered in ED Medications  ibuprofen (ADVIL,MOTRIN) tablet 400 mg (400 mg Oral Given 09/13/17 1830)  sodium chloride 0.9 % bolus 500 mL (0 mLs Intravenous Stopped 09/13/17 2211)     Initial Impression /  Assessment and Plan / ED Course  I have reviewed the triage vital signs and the nursing notes.  Pertinent labs & imaging results that were available during my care of the patient were reviewed by me and considered in my medical decision making (see chart for details).    Patient presents with 3 days of bilateral low back pain without fever, no CVA tenderness.   UA with evidence of UTI, sent out for culture. Elevated blood pressure and has been noncompliant with medications for a month.  Discussed this with patient and urged to follow-up with PCP.  Discharge home with short supply of previously prescribed antihypertensive.  Patient is otherwise well-appearing, nontoxic and afebrile. Slight elevation in creatinine and slightly tachycardic, she was given IV fluids with improvement of tachycardia.  Patient presents with lower back pain.  No gross neurological deficits and normal neuro exam.  Patient has no gait abnormality or concern for cauda equina.  No loss of bowel or bladder control, fever, night sweats, weight loss, h/o malignancy, or IVDU.  RICE protocol and pain medications indicated and discussed with patient.   Discharge home with  antibiotics, symptomatic relief and close follow-up with PCP.  Discussed strict return precautions and advised to return to the emergency department if experiencing any new or worsening symptoms. Instructions were understood and patient agreed with discharge plan.  Final Clinical Impressions(s) / ED Diagnoses   Final diagnoses:  Acute cystitis without hematuria  Acute bilateral low back pain without sciatica    ED Discharge Orders        Ordered    methocarbamol (ROBAXIN) 500 MG tablet  At bedtime PRN     09/13/17 2145    naproxen (NAPROSYN) 500 MG tablet  2 times daily     09/13/17 2145    cephALEXin (KEFLEX) 500 MG capsule  2 times daily     09/13/17 2145    Menthol, Topical Analgesic, (ICY HOT NATURALS) 7.5 % CREA  2 times daily PRN     09/13/17 2145    amLODipine (NORVASC) 5 MG tablet  Daily     09/13/17 2148       Dossie Der 09/13/17 2229    Daleen Bo, MD 09/14/17 1314

## 2017-09-13 NOTE — ED Notes (Signed)
Patient ambulated in hall without issue or symptoms

## 2017-09-13 NOTE — ED Triage Notes (Addendum)
Pt states 3 days of bilateral lower back pain radiating to bilateral flanks. Denies urinary symptoms. Denies hx of kidney stone. Denies heavy lifting or injury. Pt denies [facility] LMP years ago. PT states she took muscle relaxerS and had no relief.

## 2017-09-13 NOTE — Discharge Instructions (Signed)
As discussed, make sure that you stay well-hydrated drinking enough fluids to keep your urine clear and take your entire course of antibiotics even if you feel better.  As discussed, The medicine prescribed can help with muscle spasm but cannot be taken if driving, with alcohol or operating machinery.  Apply warm compresses, muscle creams and massage the area to help with your symptoms.  Follow the back exercises provided  Follow up with your Primary care provider this week.  Return if worsening or new concerning symptoms fever, chills, loss of bowel bladder function, weakness or numbness in your lower extremities.

## 2017-09-15 LAB — URINE CULTURE

## 2018-08-11 ENCOUNTER — Ambulatory Visit: Payer: Self-pay | Admitting: Internal Medicine

## 2018-08-11 ENCOUNTER — Other Ambulatory Visit: Payer: Self-pay

## 2018-08-11 ENCOUNTER — Encounter: Payer: Self-pay | Admitting: Internal Medicine

## 2018-08-11 DIAGNOSIS — Z79899 Other long term (current) drug therapy: Secondary | ICD-10-CM

## 2018-08-11 DIAGNOSIS — Z0000 Encounter for general adult medical examination without abnormal findings: Secondary | ICD-10-CM | POA: Insufficient documentation

## 2018-08-11 DIAGNOSIS — J45909 Unspecified asthma, uncomplicated: Secondary | ICD-10-CM

## 2018-08-11 DIAGNOSIS — I10 Essential (primary) hypertension: Secondary | ICD-10-CM

## 2018-08-11 MED ORDER — AMLODIPINE BESYLATE 5 MG PO TABS: 5.0000 mg | ORAL_TABLET | Freq: Every day | ORAL | 0 refills | Status: DC

## 2018-08-11 MED ORDER — ALBUTEROL SULFATE HFA 108 (90 BASE) MCG/ACT IN AERS: 1.0000 | INHALATION_SPRAY | Freq: Four times a day (QID) | RESPIRATORY_TRACT | 0 refills | Status: DC | PRN

## 2018-08-11 NOTE — Assessment & Plan Note (Signed)
Patient's blood pressure during this visit was 148/97. Patient states that she used to take amlodipine 5mg  qd, but has not been on any anti htn agent for the past 6 months.   Assessment and plan Restart patient's previous antihypertensive medication.  Refilled amlodipine 5 mg daily.  Gave patient a 30-day prescription of amlodipine from sample.  Advised her to follow-up in 4 weeks for blood pressure recheck.

## 2018-08-11 NOTE — Progress Notes (Signed)
   CC: Establish care and hypertension follow up  HPI:  Amy Barajas is a 48 y.o. female with history of asthma and hypertension who presents to establish care at [facility] and for hypertension follow up. Please see problem based charting for evaluation, assessment, and plan. Patient was previously being seen at [facility] but stopped going there approximately one year ago.   Past Medical History:  Diagnosis Date  . Acid reflux   . Asthma   . Chronic UTI   . Hypertension    Review of Systems:    Review of Systems  Constitutional: Negative for chills and fever.  Respiratory: Negative for cough, sputum production and shortness of breath.   Cardiovascular: Negative for chest pain.  Gastrointestinal: Negative for abdominal pain, nausea and vomiting.  Musculoskeletal: Positive for joint pain (right knee).  Neurological: Positive for dizziness. Negative for headaches.  Psychiatric/Behavioral: Positive for depression.   Physical Exam:  Vitals:   08/11/18 1331 08/11/18 1335 08/11/18 1336 08/11/18 1338  BP: (!) 142/94 (!) 148/97 (!) 155/93 (!) 146/90  Pulse: 96 96 97 100  Temp: 98.4 F (36.9 C)     TempSrc: Oral     Weight: 248 lb 6.4 oz (112.7 kg)     Height: 5\' 9"  (1.753 m)      Physical Exam  Constitutional: Appears well-developed and well-nourished. No distress.  HENT:  Head: Normocephalic and atraumatic.  Eyes: Conjunctivae are normal.  Cardiovascular: Normal rate, regular rhythm and normal heart sounds.  Respiratory: Effort normal and breath sounds normal. No respiratory distress. No wheezes.  GI: Soft. Bowel sounds are normal. No distension. There is no tenderness.  Musculoskeletal: No edema.  Neurological: Is alert.  Skin: Not diaphoretic. No erythema.  Psychiatric: Normal mood and affect. Behavior is normal. Judgment and thought content normal.   Assessment & Plan:   See Encounters Tab for problem based charting.  Patient discussed with Dr.  Lynnae January

## 2018-08-11 NOTE — Patient Instructions (Signed)
It was a pleasure to see you today Ms. Epps. Please make the following changes:  -I have filled your amlodipine 5 mg once daily medication for blood pressure -I have refilled your albuterol inhaler to be used on an as-needed basis -Please make lifestyle modifications of diet and regular exercise -Please follow-up in 4 weeks  If you have any questions or concerns, please call our clinic at [phone] between 9am-5pm and after hours call [phone] and ask for the internal medicine resident on call. If you feel you are having a medical emergency please call 911.   Thank you, we look forward to help you remain healthy!  Lars Mage, MD Internal Medicine PGY2

## 2018-08-11 NOTE — Assessment & Plan Note (Signed)
Patient does not have orange card at this time which limits [place] from ordering any labs or imaging exams.  Will reassess after she has obtained orange card.

## 2018-08-11 NOTE — Assessment & Plan Note (Signed)
Refilled patient's albuterol inhaler.  She has not needed to use her albuterol inhaler frequently.

## 2018-08-11 NOTE — Progress Notes (Signed)
Internal Medicine Clinic Attending  Case discussed with Dr. Chundi at the time of the visit.  We reviewed the resident's history and exam and pertinent patient test results.  I agree with the assessment, diagnosis, and plan of care documented in the resident's note. 

## 2018-08-12 NOTE — Addendum Note (Signed)
Addended by: Lars Mage on: 08/12/2018 10:11 AM   Modules accepted: Level of Service

## 2018-08-13 ENCOUNTER — Encounter: Payer: Self-pay | Admitting: Internal Medicine

## 2018-08-25 ENCOUNTER — Ambulatory Visit: Payer: Self-pay

## 2018-09-03 ENCOUNTER — Other Ambulatory Visit: Payer: Self-pay

## 2018-09-03 ENCOUNTER — Ambulatory Visit: Payer: Self-pay | Admitting: Internal Medicine

## 2018-09-03 DIAGNOSIS — I10 Essential (primary) hypertension: Secondary | ICD-10-CM

## 2018-09-03 NOTE — Progress Notes (Signed)
Medicine attending: Scheduled telephone visit. Patient did not answer phone.

## 2018-09-03 NOTE — Progress Notes (Signed)
   CC: Hypertension follow up   Attempted to call patient but she was not able to be reached and voicemail was too full to leave message.

## 2019-11-18 ENCOUNTER — Encounter: Payer: Self-pay | Admitting: *Deleted

## 2020-03-07 ENCOUNTER — Telehealth: Payer: Self-pay | Admitting: *Deleted

## 2020-03-07 NOTE — Telephone Encounter (Signed)
Copied from [facility] [id number]. Topic: General - Other >> Mar 07, 2020 11:29 AM Rainey Pines A wrote: Pt stated that she has been awaiting on a call from [facility] in regards to financial assistance

## 2020-03-13 NOTE — Telephone Encounter (Signed)
I return Pt call, LVM that she need to see the PCP on 03/14/20 after that she can schedule a financial appt with me also pick the application at the [facility]

## 2020-03-14 ENCOUNTER — Encounter: Payer: Self-pay | Admitting: Nurse Practitioner

## 2020-03-14 ENCOUNTER — Ambulatory Visit: Payer: Self-pay | Attending: Nurse Practitioner | Admitting: Nurse Practitioner

## 2020-03-14 ENCOUNTER — Other Ambulatory Visit: Payer: Self-pay | Admitting: Nurse Practitioner

## 2020-03-14 ENCOUNTER — Other Ambulatory Visit: Payer: Self-pay

## 2020-03-14 DIAGNOSIS — J45909 Unspecified asthma, uncomplicated: Secondary | ICD-10-CM

## 2020-03-14 DIAGNOSIS — I10 Essential (primary) hypertension: Secondary | ICD-10-CM

## 2020-03-14 MED ORDER — ALBUTEROL SULFATE HFA 108 (90 BASE) MCG/ACT IN AERS: 1.0000 | INHALATION_SPRAY | Freq: Four times a day (QID) | RESPIRATORY_TRACT | 1 refills | Status: DC | PRN

## 2020-03-14 MED ORDER — CLOTRIMAZOLE-BETAMETHASONE 1-0.05 % EX CREA: 1.0000 "application " | TOPICAL_CREAM | Freq: Two times a day (BID) | CUTANEOUS | 1 refills | Status: DC

## 2020-03-14 MED ORDER — AMLODIPINE BESYLATE 10 MG PO TABS: 10.0000 mg | ORAL_TABLET | Freq: Every day | ORAL | 1 refills | Status: DC

## 2020-03-14 NOTE — Progress Notes (Signed)
Virtual Visit via Telephone Note Due to national recommendations of social distancing due to [place] 19, telehealth visit is felt to be most appropriate for this patient at this time.  I discussed the limitations, risks, security and privacy concerns of performing an evaluation and management service by telephone and the availability of in person appointments. I also discussed with the patient that there may be a patient responsible charge related to this service. The patient expressed understanding and agreed to proceed.    I connected with Amy Barajas on 03/14/20  at   1:50 PM EDT  EDT by telephone and verified that I am speaking with the correct person using two identifiers.   Consent I discussed the limitations, risks, security and privacy concerns of performing an evaluation and management service by telephone and the availability of in person appointments. I also discussed with the patient that there may be a patient responsible charge related to this service. The patient expressed understanding and agreed to proceed.   Location of Patient: Private Residence   [facility] of Provider: [facility] [employer] [facility]    Persons participating in Telemedicine visit: Amy Rankins FNP-BC [place]    History of Present Illness: Telemedicine visit for: Establish Care  has a past medical history of Acid reflux, Asthma, Chronic UTI, and Hypertension.  Patient has been counseled on age-appropriate routine health concerns for screening and prevention. These are reviewed and up-to-date. Referrals have been placed accordingly. Immunizations are up-to-date or declined.    Mammogram: Overdue; Referred to [facility] PAP SMEAR: scheduled  She has a knot on the right side of lumbar spine. It not painful however it does drain at times. It has been there for years.   Essential Hypertension Her last visit with a PCP was 09/03/2018.  Diagnosed 10 years ago.  She monitors her blood pressure at home infrequently. She can not recall any previous blood pressure readings but states it has been high. Denies chest pain, shortness of breath, palpitations, lightheadedness, dizziness,or BLE edema. She does endorse headaches and floaters when her blood pressure "is high".  BP Readings from Last 3 Encounters:  08/11/18 (!) 146/90  09/13/17 (!) 146/96  02/04/17 (!) 170/104    Asthma She has a history of childhood asthma. Symptoms are controlled with prn use of albuterol inhaler.    Past Medical History:  Diagnosis Date   Acid reflux    Asthma    Chronic UTI    Hypertension     Past Surgical History:  Procedure Laterality Date   CESAREAN SECTION  1989   CHOLECYSTECTOMY  1994   TUBAL LIGATION  1998    Family History  Problem Relation Age of Onset   Diabetes Father    Hypertension Mother    Hyperlipidemia Mother    Hypertension Maternal Grandmother    Breast cancer Maternal Grandmother     Social History   Socioeconomic History   Marital status: Married    Spouse name: Not on file   Number of children: Not on file   Years of education: Not on file   Highest education level: Not on file  Occupational History   Not on file  Tobacco Use   Smoking status: Never Smoker   Smokeless tobacco: Never Used  Substance and Sexual Activity   Alcohol use: No   Drug use: No   Sexual activity: Not on file  Other Topics Concern   Not on file  Social History Narrative  Not on file   Social Determinants of Health   Financial Resource Strain:    Difficulty of Paying Living Expenses: Not on file  Food Insecurity:    Worried About [place] in the Last Year: Not on file   Ran Out of Food in the Last Year: Not on file  Transportation Needs:    Lack of Transportation (Medical): Not on file   Lack of Transportation (Non-Medical): Not on file  Physical Activity:    Days of Exercise per Week: Not on file    Minutes of Exercise per Session: Not on file  Stress:    Feeling of Stress : Not on file  Social Connections:    Frequency of Communication with Friends and Family: Not on file   Frequency of Social Gatherings with Friends and Family: Not on file   Attends Religious Services: Not on file   Active Member of Clubs or Organizations: Not on file   Attends Archivist Meetings: Not on file   Marital Status: Not on file     Observations/Objective: Awake, alert and oriented x 3   Review of Systems  Constitutional: Negative for fever, malaise/fatigue and weight loss.  HENT: Negative.  Negative for nosebleeds.   Eyes: Negative.  Negative for blurred vision, double vision and photophobia.  Respiratory: Negative.  Negative for cough and shortness of breath.   Cardiovascular: Negative.  Negative for chest pain, palpitations and leg swelling.  Gastrointestinal: Negative.  Negative for heartburn, nausea and vomiting.  Musculoskeletal: Negative.  Negative for myalgias.  Skin:       See HPI  Neurological: Negative.  Negative for dizziness, focal weakness, seizures and headaches.  Psychiatric/Behavioral: Negative.  Negative for suicidal ideas.    Assessment and Plan: [name] was seen today for establish care.  Diagnoses and all orders for this visit:  Essential hypertension -     amLODipine (NORVASC) 10 MG tablet; Take 1 tablet (10 mg total) by mouth daily. She will upload her blood pressure readings via MyChart over the next 2 weeks when she starts her amlodipine.  Mild asthma without complication, unspecified whether persistent -     albuterol (VENTOLIN HFA) 108 (90 Base) MCG/ACT inhaler; Inhale 1-2 puffs into the lungs every 6 (six) hours as needed for wheezing or shortness of breath.     Follow Up Instructions Return for PAP SMEAR.     I discussed the assessment and treatment plan with the patient. The patient was provided an opportunity to ask questions and all were  answered. The patient agreed with the plan and demonstrated an understanding of the instructions.   The patient was advised to call back or seek an in-person evaluation if the symptoms worsen or if the condition fails to improve as anticipated.  I provided 16 minutes of non-face-to-face time during this encounter including median intraservice time, reviewing previous notes, labs, imaging, medications and explaining diagnosis and management.  Gildardo Pounds, FNP-BC

## 2020-03-16 ENCOUNTER — Other Ambulatory Visit: Payer: Self-pay | Admitting: Nurse Practitioner

## 2020-03-16 ENCOUNTER — Other Ambulatory Visit: Payer: Self-pay

## 2020-03-16 DIAGNOSIS — Z1211 Encounter for screening for malignant neoplasm of colon: Secondary | ICD-10-CM

## 2020-03-16 DIAGNOSIS — Z1159 Encounter for screening for other viral diseases: Secondary | ICD-10-CM

## 2020-03-16 DIAGNOSIS — Z13228 Encounter for screening for other metabolic disorders: Secondary | ICD-10-CM

## 2020-03-16 DIAGNOSIS — Z1329 Encounter for screening for other suspected endocrine disorder: Secondary | ICD-10-CM

## 2020-03-16 DIAGNOSIS — Z13220 Encounter for screening for lipoid disorders: Secondary | ICD-10-CM

## 2020-03-16 DIAGNOSIS — Z131 Encounter for screening for diabetes mellitus: Secondary | ICD-10-CM

## 2020-03-16 DIAGNOSIS — Z130 Encounter for screening for diseases of the blood and blood-forming organs and certain disorders involving the immune mechanism: Secondary | ICD-10-CM

## 2020-03-16 DIAGNOSIS — Z114 Encounter for screening for human immunodeficiency virus [HIV]: Secondary | ICD-10-CM

## 2020-03-16 DIAGNOSIS — E559 Vitamin D deficiency, unspecified: Secondary | ICD-10-CM

## 2020-03-22 ENCOUNTER — Other Ambulatory Visit: Payer: Self-pay

## 2020-04-25 ENCOUNTER — Ambulatory Visit: Payer: Self-pay | Admitting: Nurse Practitioner

## 2020-05-15 ENCOUNTER — Other Ambulatory Visit: Payer: Self-pay | Admitting: Emergency Medicine

## 2020-05-15 ENCOUNTER — Ambulatory Visit
Admission: EM | Admit: 2020-05-15 | Discharge: 2020-05-15 | Disposition: A | Discharge status: Home / Self Care | Payer: Self-pay | Attending: Emergency Medicine | Admitting: Emergency Medicine

## 2020-05-15 DIAGNOSIS — M25511 Pain in right shoulder: Secondary | ICD-10-CM

## 2020-05-15 DIAGNOSIS — K219 Gastro-esophageal reflux disease without esophagitis: Secondary | ICD-10-CM

## 2020-05-15 MED ORDER — FAMOTIDINE 40 MG PO TABS: 40.0000 mg | ORAL_TABLET | Freq: Every day | ORAL | 0 refills | Status: DC

## 2020-05-15 MED ORDER — CYCLOBENZAPRINE HCL 5 MG PO TABS: 5.0000 mg | ORAL_TABLET | Freq: Two times a day (BID) | ORAL | 0 refills | Status: DC | PRN

## 2020-05-15 MED ORDER — LIDOCAINE VISCOUS HCL 2 % MT SOLN
15.0000 mL | Freq: Once | OROMUCOSAL | Status: AC
  Administered 2020-05-15: 15 mL via ORAL

## 2020-05-15 MED ORDER — NAPROXEN 500 MG PO TABS: 500.0000 mg | ORAL_TABLET | Freq: Two times a day (BID) | ORAL | 0 refills | Status: DC

## 2020-05-15 MED ORDER — ALUM & MAG HYDROXIDE-SIMETH 200-200-20 MG/5ML PO SUSP
30.0000 mL | Freq: Once | ORAL | Status: AC
  Administered 2020-05-15: 30 mL via ORAL

## 2020-05-15 NOTE — Discharge Instructions (Signed)

## 2020-05-15 NOTE — ED Provider Notes (Signed)
[name]    CSN: [id number] Arrival date & time: 05/15/20  1139      History   Chief Complaint Chief Complaint  Patient presents with  . Chest Pain    HPI Amy Barajas is a 49 y.o. female  With history as below presenting for right-sided chest and back pain.  States it began last night.  Is right-hand dominant, does a lot of lifting, bending, pulling at work and at home as she has "20 grandchildren ".  Does admit to reflux, though does not take anything for this.  Denying left-sided chest pain, palpitations, lightheadedness or dizziness.    Past Medical History:  Diagnosis Date  . Acid reflux   . Asthma   . Chronic UTI   . Hypertension     Patient Active Problem List   Diagnosis Date Noted  . Mild asthma without complication 94/49/6759  . Health care maintenance 08/11/2018  . Essential hypertension 11/08/2014    Past Surgical History:  Procedure Laterality Date  . CESAREAN SECTION  1989  . CHOLECYSTECTOMY  1994  . TUBAL LIGATION  1998    OB History   No obstetric history on file.      Home Medications    Prior to Admission medications   Medication Sig Start Date End Date Taking? Authorizing Provider  albuterol (VENTOLIN HFA) 108 (90 Base) MCG/ACT inhaler Inhale 1-2 puffs into the lungs every 6 (six) hours as needed for wheezing or shortness of breath. 03/14/20   Gildardo Pounds, NP  amLODipine (NORVASC) 10 MG tablet Take 1 tablet (10 mg total) by mouth daily. 03/14/20 06/12/20  Gildardo Pounds, NP  clotrimazole-betamethasone (LOTRISONE) cream Apply 1 application topically 2 (two) times daily. 03/14/20   Gildardo Pounds, NP  cyclobenzaprine (FLEXERIL) 5 MG tablet Take 1 tablet (5 mg total) by mouth 2 (two) times daily as needed for up to 7 days for muscle spasms. 05/15/20 05/22/20  Hall-Potvin, [place], PA-C  famotidine (PEPCID) 40 MG tablet Take 1 tablet (40 mg total) by mouth daily. 05/15/20   Hall-Potvin, [place], PA-C   naproxen (NAPROSYN) 500 MG tablet Take 1 tablet (500 mg total) by mouth 2 (two) times daily. 05/15/20   Hall-Potvin, [place], PA-C    Family History Family History  Problem Relation Age of Onset  . Diabetes Father   . Hypertension Mother   . Hyperlipidemia Mother   . Hypertension Maternal Grandmother   . Breast cancer Maternal Grandmother     Social History Social History   Tobacco Use  . Smoking status: Never Smoker  . Smokeless tobacco: Never Used  Substance Use Topics  . Alcohol use: No  . Drug use: No     Allergies   Patient has no known allergies.   Review of Systems Review of Systems  Constitutional: Negative for fatigue and fever.  HENT: Negative for ear pain, sinus pain, sore throat and voice change.   Eyes: Negative for pain, redness and visual disturbance.  Respiratory: Negative for cough and shortness of breath.   Cardiovascular: Positive for chest pain. Negative for palpitations.  Gastrointestinal: Negative for abdominal pain, diarrhea and vomiting.  Musculoskeletal: Positive for back pain. Negative for arthralgias and myalgias.  Skin: Negative for rash and wound.  Neurological: Negative for syncope and headaches.     Physical Exam Triage Vital Signs ED Triage Vitals  Enc Vitals Group     BP 05/15/20 1153 (!) 172/129     Pulse Rate 05/15/20 1153 88  Resp 05/15/20 1153 18     Temp 05/15/20 1153 98 F (36.7 C)     Temp Source 05/15/20 1153 Oral     SpO2 05/15/20 1153 99 %     Weight --      Height --      Head Circumference --      Peak Flow --      Pain Score 05/15/20 1154 8     Pain Loc --      Pain Edu? --      Excl. in [place]? --    No data found.  Updated Vital Signs BP (!) 172/129 (BP Location: Left Arm)   Pulse 88   Temp 98 F (36.7 C) (Oral)   Resp 18   LMP 10/04/2015 (Approximate)   SpO2 99%   Visual Acuity Right Eye Distance:   Left Eye Distance:   Bilateral Distance:    Right Eye Near:   Left Eye Near:     Bilateral Near:     Physical Exam Constitutional:      General: She is not in acute distress. HENT:     Head: Normocephalic and atraumatic.  Eyes:     General: No scleral icterus.    Pupils: Pupils are equal, round, and reactive to light.  Neck:     Comments: Trachea midline, negative JVD. Cardiovascular:     Rate and Rhythm: Normal rate.  Pulmonary:     Effort: Pulmonary effort is normal.  Musculoskeletal:        General: Tenderness present. No swelling. Normal range of motion.     Comments: Right trapezius and lateral pectoralis tender, somewhat tense.  No crepitus, fluctuance.  NVI  Skin:    Coloration: Skin is not jaundiced or pale.  Neurological:     Mental Status: She is alert and oriented to person, place, and time.      UC Treatments / Results  Labs (all labs ordered are listed, but only abnormal results are displayed) Labs Reviewed - No data to display  EKG   Radiology No results found.  Procedures Procedures (including critical care time)  Medications Ordered in UC Medications  alum & mag hydroxide-simeth (MAALOX/MYLANTA) 200-200-20 MG/5ML suspension 30 mL (30 mLs Oral Given 05/15/20 1223)    And  lidocaine (XYLOCAINE) 2 % viscous mouth solution 15 mL (15 mLs Oral Given 05/15/20 1223)    Initial Impression / Assessment and Plan / UC Course  I have reviewed the triage vital signs and the nursing notes.  Pertinent labs & imaging results that were available during my care of the patient were reviewed by me and considered in my medical decision making (see chart for details).     Patient afebrile, nontoxic, no acute distress.  GI cocktail given: Minimal relief.  EKG done office, reviewed by me compared to previous from 12/30/2016: NSR with reticular 82 bpm.  No QTC prolongation.  Patient does have T wave inversion in leads V5, V6, aVF, III, and II, though these are stable.  Cardiac work-up done in ER at that time unremarkable.  Exam more concerning for  MSK strain.  Will treat GERD, MSK pain as below, follow-up with PCP as needed.  Return precautions discussed, pt verbalized understanding and is agreeable to plan. Final Clinical Impressions(s) / UC Diagnoses   Final diagnoses:  Gastroesophageal reflux disease, unspecified whether esophagitis present  Acute pain of right shoulder     Discharge Instructions     Heat therapy (hot compress, warm wash  rag, hot showers, etc.) can help relax muscles and soothe muscle aches. Cold therapy (ice packs) can be used to help swelling both after injury and after prolonged use of areas of chronic pain/aches.  Pain medication:  500 mg Naprosyn/Aleve (naproxen) every 12 hours with food:  AVOID other NSAIDs while taking this (may have Tylenol).  May take muscle relaxer as needed for severe pain / spasm.  (This medication may cause you to become tired so it is important you do not drink alcohol or operate heavy machinery while on this medication.  Recommend your first dose to be taken before bedtime to monitor for side effects safely)  Important to follow up with specialist(s) below for further evaluation/management if your symptoms persist or worsen.    ED Prescriptions    Medication Sig Dispense Auth. Provider   famotidine (PEPCID) 40 MG tablet Take 1 tablet (40 mg total) by mouth daily. 30 tablet Hall-Potvin, [place], PA-C   naproxen (NAPROSYN) 500 MG tablet Take 1 tablet (500 mg total) by mouth 2 (two) times daily. 30 tablet Hall-Potvin, [place], PA-C   cyclobenzaprine (FLEXERIL) 5 MG tablet Take 1 tablet (5 mg total) by mouth 2 (two) times daily as needed for up to 7 days for muscle spasms. 14 tablet Hall-Potvin, [place], PA-C     PDMP not reviewed this encounter.   Hall-Potvin, [place], [place] 05/15/20 1338

## 2020-05-15 NOTE — ED Triage Notes (Signed)
Pt c/o center to rt sided chest pain since this am. States pain started in rt upper back that has now subsided. Pt states pain is constant. States took tums and aleve with no relief. Pt states having SOB d/t to pain. Pt states hasn't had her b/p med in over a week. No distress noted at this time.

## 2020-09-26 ENCOUNTER — Ambulatory Visit
Admission: EM | Admit: 2020-09-26 | Discharge: 2020-09-26 | Disposition: A | Discharge status: Home / Self Care | Payer: Self-pay | Attending: Internal Medicine | Admitting: Internal Medicine

## 2020-09-26 ENCOUNTER — Other Ambulatory Visit: Payer: Self-pay

## 2020-09-26 DIAGNOSIS — M722 Plantar fascial fibromatosis: Secondary | ICD-10-CM

## 2020-09-26 DIAGNOSIS — I10 Essential (primary) hypertension: Secondary | ICD-10-CM

## 2020-09-26 MED ORDER — PREDNISONE 20 MG PO TABS
40.0000 mg | ORAL_TABLET | Freq: Every day | ORAL | 0 refills | Status: AC
  Filled 2020-09-26: qty 10, 5d supply, fill #0

## 2020-09-26 NOTE — ED Triage Notes (Signed)
Pt presents with complaints of pain in her right foot x 1 month. Denies any injury. Reports pain has been increasing over time. Reports she stands a lot at work. Denies relief with otc medication.

## 2020-09-26 NOTE — Discharge Instructions (Signed)
Gentle range of motion exercises Icing of your foot after work Take medications as prescribed If your symptoms worsen please return to urgent care to be reevaluated

## 2020-09-27 ENCOUNTER — Other Ambulatory Visit: Payer: Self-pay

## 2020-09-27 NOTE — ED Provider Notes (Signed)
[name]    CSN: [id number] Arrival date & time: 09/26/20  1633      History   Chief Complaint Chief Complaint  Patient presents with  . Foot Pain    HPI Amy Barajas is a 50 y.o. female comes to the urgent care with right foot pain of 1 month duration.  Patient denies any injury or falls.  She describes the pain as constant, sharp, aggravated by walking and denies any known relieving factors.  Patient works in [employer] and does a lot of walking on a day-to-day basis.  No swelling of the feet.  Patient's shoes feel comfortable.  She has tried ibuprofen and Tylenol with no improvement in his symptoms.   Patient has a history of hypertension currently on amlodipine.  She denies any headaches, chest pain or abdominal pain.  She is taking her medications today.  She takes her medications regularly.  HPI  Past Medical History:  Diagnosis Date  . Acid reflux   . Asthma   . Chronic UTI   . Hypertension     Patient Active Problem List   Diagnosis Date Noted  . Mild asthma without complication 40/98/1191  . Health care maintenance 08/11/2018  . Essential hypertension 11/08/2014    Past Surgical History:  Procedure Laterality Date  . CESAREAN SECTION  1989  . CHOLECYSTECTOMY  1994  . TUBAL LIGATION  1998    OB History   No obstetric history on file.      Home Medications    Prior to Admission medications   Medication Sig Start Date End Date Taking? Authorizing Provider  predniSONE (DELTASONE) 20 MG tablet Take 2 tablets (40 mg total) by mouth daily for 5 days. 09/26/20 10/02/20 Yes Montavis Schubring, Myrene Galas, MD  albuterol (VENTOLIN HFA) 108 (90 Base) MCG/ACT inhaler INHALE 1-2 PUFFS INTO THE LUNGS EVERY 6 (SIX) HOURS AS NEEDED FOR WHEEZING OR SHORTNESS OF BREATH. 03/14/20 03/14/21  Gildardo Pounds, NP  amLODipine (NORVASC) 10 MG tablet TAKE 1 TABLET (10 MG TOTAL) BY MOUTH DAILY. 03/14/20 03/14/21  Gildardo Pounds, NP  famotidine (PEPCID) 40 MG tablet  TAKE 1 TABLET (40 MG TOTAL) BY MOUTH DAILY. 05/15/20 05/15/21  Hall-Potvin, [place], PA-C    Family History Family History  Problem Relation Age of Onset  . Diabetes Father   . Hypertension Mother   . Hyperlipidemia Mother   . Hypertension Maternal Grandmother   . Breast cancer Maternal Grandmother     Social History Social History   Tobacco Use  . Smoking status: Never Smoker  . Smokeless tobacco: Never Used  Substance Use Topics  . Alcohol use: No  . Drug use: No     Allergies   Patient has no known allergies.   Review of Systems Review of Systems  Respiratory: Negative.   Gastrointestinal: Negative.   Musculoskeletal: Positive for arthralgias. Negative for myalgias, neck pain and neck stiffness.  Neurological: Negative.      Physical Exam Triage Vital Signs ED Triage Vitals  Enc Vitals Group     BP 09/26/20 1717 (S) (!) 162/123     Pulse Rate 09/26/20 1717 97     Resp 09/26/20 1717 19     Temp 09/26/20 1717 98.3 F (36.8 C)     Temp src --      SpO2 09/26/20 1717 96 %     Weight --      Height --      Head Circumference --  Peak Flow --      Pain Score 09/26/20 1716 8     Pain Loc --      Pain Edu? --      Excl. in [place]? --    No data found.  Updated Vital Signs BP (S) (!) 162/123 Comment: reports not taking her medication regularly  Pulse 97   Temp 98.3 F (36.8 C)   Resp 19   LMP 10/04/2015 (Approximate)   SpO2 96%   Visual Acuity Right Eye Distance:   Left Eye Distance:   Bilateral Distance:    Right Eye Near:   Left Eye Near:    Bilateral Near:     Physical Exam Vitals and nursing note reviewed.  Constitutional:      General: She is not in acute distress.    Appearance: She is not ill-appearing.  Cardiovascular:     Rate and Rhythm: Normal rate and regular rhythm.  Musculoskeletal:     Comments: Tenderness on palpation on the proximal aspect of plantar surface of the right foot.  No swelling noted.  No erythema.  No skin  changes.  No ankle swelling.  Neurological:     Mental Status: She is alert.      UC Treatments / Results  Labs (all labs ordered are listed, but only abnormal results are displayed) Labs Reviewed - No data to display  EKG   Radiology No results found.  Procedures Procedures (including critical care time)  Medications Ordered in UC Medications - No data to display  Initial Impression / Assessment and Plan / UC Course  I have reviewed the triage vital signs and the nursing notes.  Pertinent labs & imaging results that were available during my care of the patient were reviewed by me and considered in my medical decision making (see chart for details).     1.  Plantar fasciitis of the right foot: Right foot exercises recommended Short course of prednisone If symptoms persist patient may need to see a podiatrist In shoe inserts recommended for the patient. Return precautions given  2.  Hypertension, uncontrolled: This is likely related to the pain in the right foot Patient encouraged to be compliant with her medications. Return to urgent care or go to the ED if blood pressure worsens. Final Clinical Impressions(s) / UC Diagnoses   Final diagnoses:  Plantar fasciitis of right foot  Hypertension, uncontrolled     Discharge Instructions     Gentle range of motion exercises Icing of your foot after work Take medications as prescribed If your symptoms worsen please return to urgent care to be reevaluated   ED Prescriptions    Medication Sig Dispense Auth. Provider   predniSONE (DELTASONE) 20 MG tablet Take 2 tablets (40 mg total) by mouth daily for 5 days. 10 tablet Angila Wombles, Myrene Galas, MD     PDMP not reviewed this encounter.   Chase Picket, MD 09/27/20 1059

## 2020-10-04 ENCOUNTER — Other Ambulatory Visit: Payer: Self-pay

## 2020-10-12 ENCOUNTER — Other Ambulatory Visit: Payer: Self-pay

## 2020-10-12 DIAGNOSIS — N6452 Nipple discharge: Secondary | ICD-10-CM

## 2020-11-13 ENCOUNTER — Ambulatory Visit
Admission: RE | Admit: 2020-11-13 | Discharge: 2020-11-13 | Disposition: A | Discharge status: Home / Self Care | Payer: No Typology Code available for payment source | Source: Ambulatory Visit | Attending: Obstetrics and Gynecology | Admitting: Obstetrics and Gynecology

## 2020-11-13 ENCOUNTER — Other Ambulatory Visit: Payer: Self-pay

## 2020-11-13 ENCOUNTER — Ambulatory Visit: Payer: No Typology Code available for payment source | Admitting: *Deleted

## 2020-11-13 ENCOUNTER — Other Ambulatory Visit: Payer: Self-pay | Admitting: Obstetrics and Gynecology

## 2020-11-13 ENCOUNTER — Other Ambulatory Visit (HOSPITAL_COMMUNITY)
Admission: RE | Admit: 2020-11-13 | Discharge: 2020-11-13 | Disposition: A | Discharge status: Home / Self Care | Payer: No Typology Code available for payment source | Source: Ambulatory Visit | Attending: Obstetrics and Gynecology | Admitting: Obstetrics and Gynecology

## 2020-11-13 VITALS — BP 150/106 | Wt 236.4 lb

## 2020-11-13 DIAGNOSIS — N6452 Nipple discharge: Secondary | ICD-10-CM

## 2020-11-13 DIAGNOSIS — Z1239 Encounter for other screening for malignant neoplasm of breast: Secondary | ICD-10-CM

## 2020-11-13 NOTE — Patient Instructions (Addendum)
Explained breast self awareness withTonya Monique Diperna Epps. Patient refused Pap smear today. Pap smear scheduled at the cervical cancer screening clinic on Wednesday, January 30, 2021 at 1045. Let her know [facility] will cover Pap smears every 3 years unless has a history of abnormal Pap smears. Referred patient to the [facility] for a diagnostic mammogram. Appointment scheduled Tuesday, November 13, 2020 at 1240. Patient aware of appointment and will be there. Let patient know that we will call her within the next couple of weeks with results of her breast discharge by phone. Glen verbalized understanding.  Erma Raiche, Arvil Chaco, RN 11:25 AM

## 2020-11-13 NOTE — Progress Notes (Signed)
Ms. Peightyn Roberson Chizara Mena is a 50 y.o. female who presents to [facility] clinic today with complaint of spontaneous clear right breast nipple discharge x one year.    Pap Smear: Pap smear not completed today. Last Pap smear was over 5 years ago at Dr. Marcheta Grammes clinic and was normal per patient. Per patient has no history of an abnormal Pap smear. Last Pap smear result is not available in Epic.   Physical exam: Breasts Breasts symmetrical. No skin abnormalities bilateral breasts. No nipple retraction bilateral breasts. No nipple discharge left breast. Expressed a clear nipple discharge from right breast on exam. Sample of discharge sent to Cytology for evaluation. No lymphadenopathy. No lumps palpated bilateral breasts. No complaints of pain or tenderness on exam.      MS DIGITAL DIAG TOMO BILAT  Result Date: 11/13/2020 CLINICAL DATA:  Clear spontaneous nipple RIGHT discharge for 1 year. EXAM: DIGITAL DIAGNOSTIC BILATERAL MAMMOGRAM WITH TOMOSYNTHESIS AND CAD; ULTRASOUND RIGHT BREAST LIMITED TECHNIQUE: Bilateral digital diagnostic mammography and breast tomosynthesis was performed. The images were evaluated with computer-aided detection.; Targeted ultrasound examination of the right breast was performed COMPARISON:  None. ACR Breast Density Category c: The breast tissue is heterogeneously dense, which may obscure small masses. FINDINGS: No suspicious mass, distortion, or microcalcifications are identified to suggest presence of malignancy. Targeted ultrasound is performed, showing a mildly dilated duct in the immediate retroareolar region of the RIGHT breast which contains a filling defect. Intraductal mass measures 0.7 x 0.4 x 0.4 centimeters. There is minimal internal blood flow associated with the mass. Evaluation of the RIGHT axilla is negative for adenopathy. 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  RECOMMENDATION: Recommend ultrasound-guided core biopsy of the RIGHT breast. I have discussed the findings and recommendations with the patient. If applicable, a reminder letter will be sent to the patient regarding the next appointment. BI-RADS CATEGORY  4: Suspicious. Electronically Signed   By: Nolon Nations M.D.   On: 11/13/2020 14:12    Pelvic/Bimanual Patient refused Pap smear today. Pap smear scheduled at the cervical cancer screening clinic on Wednesday, January 30, 2021 at 1045.   Smoking History: Patient has never smoked.   Patient Navigation: Patient education provided. Access to services provided for patient through [facility] program.   Colorectal Cancer Screening: Per patient has never had colonoscopy completed. No complaints today.    Breast and Cervical Cancer Risk Assessment: Patient has family history of her mother, sister, and maternal grandmother having breast cancer. Patient has no known genetic mutations or history of radiation treatment to the chest before age 65. Patient does not have history of cervical dysplasia, immunocompromised, or DES exposure in-utero.  Risk Assessment     Risk Scores       11/13/2020   Last edited by: Demetrius Revel, LPN   5-year risk: 3.1 %   Lifetime risk: 20.3 %            A: [facility] exam without pap smear Complaint of spontaneous clear right breast discharge.  P: Referred patient to the [facility] for a diagnostic mammogram. Appointment scheduled Tuesday, November 13, 2020 at 1240.  Loletta Parish, RN 11/13/2020 11:25 AM

## 2020-11-14 LAB — CYTOLOGY - NON PAP

## 2020-11-16 ENCOUNTER — Other Ambulatory Visit: Payer: No Typology Code available for payment source

## 2020-11-20 ENCOUNTER — Telehealth: Payer: Self-pay

## 2020-11-20 NOTE — Progress Notes (Signed)
Please call patient with negative results.

## 2020-11-20 NOTE — Telephone Encounter (Signed)
Left message for patient about lab results. Left name and number for patient to call back. 

## 2020-11-22 ENCOUNTER — Telehealth: Payer: Self-pay

## 2020-11-22 NOTE — Telephone Encounter (Signed)
Attempted to contact patient regarding lab results. Left message on voicemail requesting return call.

## 2020-11-26 ENCOUNTER — Telehealth: Payer: Self-pay

## 2020-11-26 NOTE — Telephone Encounter (Signed)
Patient returned call, was informed lab results/pathology for Right breast discharge was negative, no malignant cells seen. Patient verbalized understanding.

## 2020-11-26 NOTE — Telephone Encounter (Signed)
Attempted to contact patient regarding lab results. Left message on both numbers listed, requesting return call.

## 2020-11-27 ENCOUNTER — Other Ambulatory Visit: Payer: Self-pay

## 2020-11-27 ENCOUNTER — Ambulatory Visit
Admission: RE | Admit: 2020-11-27 | Discharge: 2020-11-27 | Disposition: A | Discharge status: Home / Self Care | Payer: No Typology Code available for payment source | Source: Ambulatory Visit | Attending: Obstetrics and Gynecology | Admitting: Obstetrics and Gynecology

## 2020-11-27 DIAGNOSIS — N6452 Nipple discharge: Secondary | ICD-10-CM

## 2021-01-11 ENCOUNTER — Ambulatory Visit: Payer: Self-pay | Admitting: Surgery

## 2021-01-11 DIAGNOSIS — D241 Benign neoplasm of right breast: Secondary | ICD-10-CM

## 2021-01-11 NOTE — H&P (Signed)
REFERRING PHYSICIAN:  Constant, Peggy, MD   PROVIDER:  MATTHEW KAI TSUEI, MD   MRN: [id number] DOB: 03/18/1971 DATE OF ENCOUNTER: 01/11/2021   Subjective    Chief Complaint: Breast Mass (Right breast intraductal papilloma)       History of Present Illness: Amy Barajas is a 50 y.o. female who is seen today as an office consultation at the request of Dr. Constant for evaluation of Breast Mass (Right breast intraductal papilloma) .     This is a 50 year old female who presents with a one year history of intermittent right nipple discharge.  Recently, she underwent work-up which revealed a right retroareolar mass at 3:00 7 x 4 x 4 mm in size.  Biopsy showed intraductal papilloma.   The patient has a family history of breast cancer in her mother, as well as her 52 year old sister who is currently being treated for breast cancer.   Review of Systems: A complete review of systems was obtained from the patient.  I have reviewed this information and discussed as appropriate with the patient.  See HPI as well for other ROS.   Review of Systems  Constitutional: Negative.   HENT: Negative.   Eyes: Negative.   Respiratory: Negative.   Cardiovascular: Negative.   Gastrointestinal: Negative.   Genitourinary: Negative.   Musculoskeletal: Negative.   Skin: Negative.   Neurological: Negative.   Endo/Heme/Allergies: Negative.   Psychiatric/Behavioral: Negative.         Medical History:     Past Medical History:  Diagnosis Date   Asthma, unspecified asthma severity, unspecified whether complicated, unspecified whether persistent     GERD (gastroesophageal reflux disease)     Hypertension           Patient Active Problem List  Diagnosis   Intraductal papilloma of breast, right           Past Surgical History:  Procedure Laterality Date   CESAREAN SECTION N/A     CHOLECYSTECTOMY OPEN N/A        No Known Allergies         Current Outpatient Medications on File Prior  to Visit  Medication Sig Dispense Refill   amLODIPine (NORVASC) 10 MG tablet Take 10 mg by mouth once daily        No current facility-administered medications on file prior to visit.           Family History  Problem Relation Age of Onset   High blood pressure (Hypertension) Mother     Hyperlipidemia (Elevated cholesterol) Mother     Breast cancer Mother     Diabetes Father     Breast cancer Sister        Social History       Tobacco Use  Smoking Status Never Smoker  Smokeless Tobacco Never Used      Social History        Socioeconomic History   Marital status: Married  Tobacco Use   Smoking status: Never Smoker   Smokeless tobacco: Never Used  Vaping Use   Vaping Use: Never used  Substance and Sexual Activity   Alcohol use: Never   Drug use: Never      Objective:         Vitals:    01/11/21 1033  BP: 110/64  Pulse: 61  Temp: 36.9 C (98.4 F)  SpO2: 96%  Weight: (!) 108.5 kg (239 lb 3.2 oz)  Height: 176.5 cm (5' 9.5")      Body mass index is 34.82 kg/m.   Physical Exam    Constitutional:  WDWN in NAD, conversant, no obvious deformities; lying in bed comfortably Eyes:  Pupils equal, round; sclera anicteric; moist conjunctiva; no lid lag HENT:  Oral mucosa moist; good dentition  Neck:  No masses palpated, trachea midline; no thyromegaly Lungs:  CTA bilaterally; normal respiratory effort Breasts:  symmetric, no nipple changes; no visible discharge today; no palpable masses or lymphadenopathy on either side CV:  Regular rate and rhythm; no murmurs; extremities well-perfused with no edema Abd:  +bowel sounds, soft, non-tender, no palpable organomegaly; no palpable hernias Musc:  Unable to assess gait; no apparent clubbing or cyanosis in extremities Lymphatic:  No palpable cervical or axillary lymphadenopathy Skin:  Warm, dry; no sign of jaundice Psychiatric - alert and oriented x 4; calm mood and affect     Labs, Imaging and Diagnostic  Testing: Diagnosis Breast, right, needle core biopsy, 3 o'clock - INTRADUCTAL PAPILLOMA - NO ATYPIA OR MALIGNANCY IDENTIFIED Microscopic Comment Dr. Vic Ripper reviewed the case and agrees with the above diagnosis. These results were called to [facility] on November 28, 2020. Thressa Sheller MD Pathologist, Electronic Signature (Case signed 11/28/2020)   CLINICAL DATA:  Clear spontaneous nipple RIGHT discharge for 1 year.   EXAM: DIGITAL DIAGNOSTIC BILATERAL MAMMOGRAM WITH TOMOSYNTHESIS AND CAD; ULTRASOUND RIGHT BREAST LIMITED   TECHNIQUE: Bilateral digital diagnostic mammography and breast tomosynthesis was performed. The images were evaluated with computer-aided detection.; Targeted ultrasound examination of the right breast was performed   COMPARISON:  None.   ACR Breast Density Category c: The breast tissue is heterogeneously dense, which may obscure small masses.   FINDINGS: No suspicious mass, distortion, or microcalcifications are identified to suggest presence of malignancy.   Targeted ultrasound is performed, showing a mildly dilated duct in the immediate retroareolar region of the RIGHT breast which contains a filling defect. Intraductal mass measures 0.7 x 0.4 x 0.4 centimeters. There is minimal internal blood flow associated with the mass.   Evaluation of the RIGHT axilla is negative for adenopathy.   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   RECOMMENDATION: Recommend ultrasound-guided core biopsy of the RIGHT breast.   I have discussed the findings and recommendations with the patient. If applicable, a reminder letter will be sent to the patient regarding the next appointment.   BI-RADS CATEGORY  4: Suspicious.     Electronically Signed   By: Nolon Nations M.D.   On: 11/13/2020 14:12   CLINICAL DATA:  Patient with indeterminate  right breast mass   EXAM: ULTRASOUND GUIDED RIGHT BREAST CORE NEEDLE BIOPSY   COMPARISON:  Previous exam(s).   PROCEDURE: I met with the patient and we discussed the procedure of ultrasound-guided biopsy, including benefits and alternatives. We discussed the high likelihood of a successful procedure. We discussed the risks of the procedure, including infection, bleeding, tissue injury, clip migration, and inadequate sampling. Informed written consent was given. The usual time-out protocol was performed immediately prior to the procedure.   Lesion quadrant: Upper inner quadrant   Using sterile technique and 1% Lidocaine as local anesthetic, under direct ultrasound visualization, a 14 gauge spring-loaded device was used to perform biopsy of right breast mass 3 o'clock position using a medial approach. At the conclusion of the procedure ribbon tissue marker clip was deployed into the biopsy cavity. Follow up 2 view mammogram was performed and dictated  separately.   IMPRESSION: Ultrasound guided biopsy of right breast mass 3 o'clock position. No apparent complications.   Electronically Signed: By: Drew  Davis M.D. On: 11/27/2020 14:22   Assessment and Plan:  Diagnoses and all orders for this visit:   Intraductal papilloma of breast, right     Right breast seed localized lumpectomy.  The surgical procedure has been discussed with the patient.  Potential risks, benefits, alternative treatments, and expected outcomes have been explained.  All of the patient's questions at this time have been answered.  The likelihood of reaching the patient's treatment goal is good.  The patient understand the proposed surgical procedure and wishes to proceed.     No follow-ups on file.   MATTHEW KAI TSUEI, MD  01/11/2021 10:47 AM      

## 2021-01-14 ENCOUNTER — Other Ambulatory Visit: Payer: Self-pay | Admitting: Surgery

## 2021-01-14 DIAGNOSIS — D241 Benign neoplasm of right breast: Secondary | ICD-10-CM

## 2021-01-30 ENCOUNTER — Ambulatory Visit: Payer: Self-pay

## 2021-02-07 ENCOUNTER — Encounter (HOSPITAL_BASED_OUTPATIENT_CLINIC_OR_DEPARTMENT_OTHER): Payer: Self-pay | Admitting: Surgery

## 2021-02-07 ENCOUNTER — Other Ambulatory Visit: Payer: Self-pay

## 2021-02-11 ENCOUNTER — Other Ambulatory Visit: Payer: Self-pay

## 2021-02-11 ENCOUNTER — Ambulatory Visit
Admission: RE | Admit: 2021-02-11 | Discharge: 2021-02-11 | Disposition: A | Discharge status: Home / Self Care | Payer: No Typology Code available for payment source | Source: Ambulatory Visit | Attending: Surgery | Admitting: Surgery

## 2021-02-11 DIAGNOSIS — D241 Benign neoplasm of right breast: Secondary | ICD-10-CM

## 2021-02-11 NOTE — Progress Notes (Signed)

## 2021-02-12 ENCOUNTER — Ambulatory Visit (HOSPITAL_BASED_OUTPATIENT_CLINIC_OR_DEPARTMENT_OTHER): Payer: No Typology Code available for payment source | Admitting: Anesthesiology

## 2021-02-12 ENCOUNTER — Encounter (HOSPITAL_BASED_OUTPATIENT_CLINIC_OR_DEPARTMENT_OTHER): Payer: Self-pay | Admitting: Surgery

## 2021-02-12 ENCOUNTER — Ambulatory Visit (HOSPITAL_BASED_OUTPATIENT_CLINIC_OR_DEPARTMENT_OTHER)
Admission: RE | Admit: 2021-02-12 | Discharge: 2021-02-12 | Disposition: A | Discharge status: Home / Self Care | Payer: No Typology Code available for payment source | Attending: Surgery | Admitting: Surgery

## 2021-02-12 ENCOUNTER — Encounter (HOSPITAL_BASED_OUTPATIENT_CLINIC_OR_DEPARTMENT_OTHER)
Admission: RE | Disposition: A | Discharge status: Home / Self Care | Payer: Self-pay | Source: Home / Self Care | Attending: Surgery

## 2021-02-12 ENCOUNTER — Other Ambulatory Visit: Payer: Self-pay

## 2021-02-12 ENCOUNTER — Ambulatory Visit
Admission: RE | Admit: 2021-02-12 | Discharge: 2021-02-12 | Disposition: A | Discharge status: Home / Self Care | Payer: No Typology Code available for payment source | Source: Ambulatory Visit | Attending: Surgery | Admitting: Surgery

## 2021-02-12 DIAGNOSIS — Z79899 Other long term (current) drug therapy: Secondary | ICD-10-CM | POA: Insufficient documentation

## 2021-02-12 DIAGNOSIS — Z803 Family history of malignant neoplasm of breast: Secondary | ICD-10-CM | POA: Insufficient documentation

## 2021-02-12 DIAGNOSIS — D241 Benign neoplasm of right breast: Secondary | ICD-10-CM

## 2021-02-12 DIAGNOSIS — N6021 Fibroadenosis of right breast: Secondary | ICD-10-CM | POA: Insufficient documentation

## 2021-02-12 DIAGNOSIS — N6489 Other specified disorders of breast: Secondary | ICD-10-CM | POA: Insufficient documentation

## 2021-02-12 HISTORY — PX: BREAST BIOPSY: SHX20

## 2021-02-12 HISTORY — PX: BREAST LUMPECTOMY WITH RADIOACTIVE SEED LOCALIZATION: SHX6424

## 2021-02-12 HISTORY — DX: Family history of other specified conditions: Z84.89

## 2021-02-12 SURGERY — BREAST LUMPECTOMY WITH RADIOACTIVE SEED LOCALIZATION
Anesthesia: General | Site: Breast | Laterality: Right

## 2021-02-12 MED ORDER — DEXAMETHASONE SODIUM PHOSPHATE 4 MG/ML IJ SOLN
INTRAMUSCULAR | Status: DC | PRN
  Administered 2021-02-12: 10 mg via INTRAVENOUS

## 2021-02-12 MED ORDER — ONDANSETRON HCL 4 MG/2ML IJ SOLN
INTRAMUSCULAR | Status: AC
  Filled 2021-02-12: qty 2

## 2021-02-12 MED ORDER — BUPIVACAINE-EPINEPHRINE 0.25% -1:200000 IJ SOLN
INTRAMUSCULAR | Status: DC | PRN
  Administered 2021-02-12: 10 mL

## 2021-02-12 MED ORDER — MIDAZOLAM HCL 2 MG/2ML IJ SOLN
INTRAMUSCULAR | Status: AC
  Filled 2021-02-12: qty 2

## 2021-02-12 MED ORDER — CHLORHEXIDINE GLUCONATE CLOTH 2 % EX PADS: 6.0000 | MEDICATED_PAD | Freq: Once | CUTANEOUS | Status: DC

## 2021-02-12 MED ORDER — PHENYLEPHRINE HCL (PRESSORS) 10 MG/ML IV SOLN
INTRAVENOUS | Status: DC | PRN
  Administered 2021-02-12: 120 ug via INTRAVENOUS
  Administered 2021-02-12: 80 ug via INTRAVENOUS
  Administered 2021-02-12: 120 ug via INTRAVENOUS
  Administered 2021-02-12: 80 ug via INTRAVENOUS

## 2021-02-12 MED ORDER — DEXAMETHASONE SODIUM PHOSPHATE 10 MG/ML IJ SOLN
INTRAMUSCULAR | Status: AC
  Filled 2021-02-12: qty 1

## 2021-02-12 MED ORDER — FENTANYL CITRATE (PF) 100 MCG/2ML IJ SOLN
INTRAMUSCULAR | Status: DC | PRN
  Administered 2021-02-12 (×2): 50 ug via INTRAVENOUS

## 2021-02-12 MED ORDER — PROPOFOL 10 MG/ML IV BOLUS
INTRAVENOUS | Status: DC | PRN
  Administered 2021-02-12: 20 mg via INTRAVENOUS
  Administered 2021-02-12: 180 mg via INTRAVENOUS

## 2021-02-12 MED ORDER — ACETAMINOPHEN 500 MG PO TABS
1000.0000 mg | ORAL_TABLET | ORAL | Status: AC
  Administered 2021-02-12: 1000 mg via ORAL

## 2021-02-12 MED ORDER — PHENYLEPHRINE 40 MCG/ML (10ML) SYRINGE FOR IV PUSH (FOR BLOOD PRESSURE SUPPORT)
PREFILLED_SYRINGE | INTRAVENOUS | Status: AC
  Filled 2021-02-12: qty 10

## 2021-02-12 MED ORDER — MIDAZOLAM HCL 5 MG/5ML IJ SOLN
INTRAMUSCULAR | Status: DC | PRN
  Administered 2021-02-12: 2 mg via INTRAVENOUS

## 2021-02-12 MED ORDER — EPHEDRINE SULFATE 50 MG/ML IJ SOLN
INTRAMUSCULAR | Status: DC | PRN
  Administered 2021-02-12: 15 mg via INTRAVENOUS

## 2021-02-12 MED ORDER — LACTATED RINGERS IV SOLN: INTRAVENOUS | Status: DC

## 2021-02-12 MED ORDER — FENTANYL CITRATE (PF) 100 MCG/2ML IJ SOLN: 25.0000 ug | INTRAMUSCULAR | Status: DC | PRN

## 2021-02-12 MED ORDER — FENTANYL CITRATE (PF) 100 MCG/2ML IJ SOLN
INTRAMUSCULAR | Status: AC
  Filled 2021-02-12: qty 2

## 2021-02-12 MED ORDER — LIDOCAINE 2% (20 MG/ML) 5 ML SYRINGE
INTRAMUSCULAR | Status: AC
  Filled 2021-02-12: qty 5

## 2021-02-12 MED ORDER — PROPOFOL 10 MG/ML IV BOLUS
INTRAVENOUS | Status: AC
  Filled 2021-02-12: qty 20

## 2021-02-12 MED ORDER — PROMETHAZINE HCL 25 MG/ML IJ SOLN: 6.2500 mg | INTRAMUSCULAR | Status: DC | PRN

## 2021-02-12 MED ORDER — ONDANSETRON HCL 4 MG/2ML IJ SOLN
INTRAMUSCULAR | Status: DC | PRN
  Administered 2021-02-12: 4 mg via INTRAVENOUS

## 2021-02-12 MED ORDER — EPHEDRINE 5 MG/ML INJ
INTRAVENOUS | Status: AC
  Filled 2021-02-12: qty 5

## 2021-02-12 MED ORDER — CEFAZOLIN SODIUM-DEXTROSE 2-4 GM/100ML-% IV SOLN
2.0000 g | INTRAVENOUS | Status: AC
  Administered 2021-02-12: 2 g via INTRAVENOUS

## 2021-02-12 MED ORDER — CEFAZOLIN SODIUM-DEXTROSE 2-4 GM/100ML-% IV SOLN
INTRAVENOUS | Status: AC
  Filled 2021-02-12: qty 100

## 2021-02-12 MED ORDER — ACETAMINOPHEN 500 MG PO TABS
ORAL_TABLET | ORAL | Status: AC
  Filled 2021-02-12: qty 2

## 2021-02-12 MED ORDER — LIDOCAINE 2% (20 MG/ML) 5 ML SYRINGE
INTRAMUSCULAR | Status: DC | PRN
  Administered 2021-02-12: 100 mg via INTRAVENOUS

## 2021-02-12 SURGICAL SUPPLY — 42 items
APL PRP STRL LF DISP 70% ISPRP (MISCELLANEOUS) ×1
APL SKNCLS STERI-STRIP NONHPOA (GAUZE/BANDAGES/DRESSINGS) ×1
APPLIER CLIP 9.375 MED OPEN (MISCELLANEOUS) ×3
APR CLP MED 9.3 20 MLT OPN (MISCELLANEOUS) ×1
BENZOIN TINCTURE PRP APPL 2/3 (GAUZE/BANDAGES/DRESSINGS) ×3 IMPLANT
BLADE HEX COATED 2.75 (ELECTRODE) ×3 IMPLANT
BLADE SURG 15 STRL LF DISP TIS (BLADE) ×1 IMPLANT
BLADE SURG 15 STRL SS (BLADE) ×3
CHLORAPREP W/TINT 26 (MISCELLANEOUS) ×3 IMPLANT
CLIP APPLIE 9.375 MED OPEN (MISCELLANEOUS) ×1 IMPLANT
CLOSURE WOUND 1/2 X4 (GAUZE/BANDAGES/DRESSINGS) ×1
COVER BACK TABLE 60X90IN (DRAPES) ×3 IMPLANT
COVER MAYO STAND STRL (DRAPES) ×3 IMPLANT
COVER PROBE W GEL 5X96 (DRAPES) ×3 IMPLANT
DRAPE LAPAROTOMY 100X72 PEDS (DRAPES) ×3 IMPLANT
DRAPE UTILITY XL STRL (DRAPES) ×3 IMPLANT
DRSG TEGADERM 4X4.75 (GAUZE/BANDAGES/DRESSINGS) ×3 IMPLANT
ELECT REM PT RETURN 9FT ADLT (ELECTROSURGICAL) ×3
ELECTRODE REM PT RTRN 9FT ADLT (ELECTROSURGICAL) ×1 IMPLANT
GAUZE SPONGE 4X4 12PLY STRL LF (GAUZE/BANDAGES/DRESSINGS) ×2 IMPLANT
GLOVE SURG ENC MOIS LTX SZ7 (GLOVE) ×3 IMPLANT
GLOVE SURG POLYISO LF SZ6 (GLOVE) ×3 IMPLANT
GLOVE SURG UNDER POLY LF SZ6.5 (GLOVE) ×3 IMPLANT
GLOVE SURG UNDER POLY LF SZ7.5 (GLOVE) ×3 IMPLANT
GOWN STRL REUS W/ TWL LRG LVL3 (GOWN DISPOSABLE) ×2 IMPLANT
GOWN STRL REUS W/TWL LRG LVL3 (GOWN DISPOSABLE) ×6
KIT MARKER MARGIN INK (KITS) ×3 IMPLANT
NEEDLE HYPO 25X1 1.5 SAFETY (NEEDLE) ×3 IMPLANT
NS IRRIG 1000ML POUR BTL (IV SOLUTION) ×3 IMPLANT
PACK BASIN DAY SURGERY FS (CUSTOM PROCEDURE TRAY) ×3 IMPLANT
PENCIL SMOKE EVACUATOR (MISCELLANEOUS) ×3 IMPLANT
SLEEVE SCD COMPRESS KNEE MED (STOCKING) ×3 IMPLANT
SPONGE GAUZE 2X2 8PLY STER LF (GAUZE/BANDAGES/DRESSINGS)
SPONGE GAUZE 2X2 8PLY STRL LF (GAUZE/BANDAGES/DRESSINGS) IMPLANT
SPONGE T-LAP 4X18 ~~LOC~~+RFID (SPONGE) ×3 IMPLANT
STRIP CLOSURE SKIN 1/2X4 (GAUZE/BANDAGES/DRESSINGS) ×2 IMPLANT
SUT MON AB 4-0 PC3 18 (SUTURE) ×3 IMPLANT
SUT VIC AB 3-0 SH 27 (SUTURE) ×3
SUT VIC AB 3-0 SH 27X BRD (SUTURE) ×1 IMPLANT
SYR CONTROL 10ML LL (SYRINGE) ×3 IMPLANT
TOWEL GREEN STERILE FF (TOWEL DISPOSABLE) ×3 IMPLANT
TRAY FAXITRON CT DISP (TRAY / TRAY PROCEDURE) ×3 IMPLANT

## 2021-02-12 NOTE — Anesthesia Postprocedure Evaluation (Signed)
Anesthesia Post Note  Patient: Amy Barajas  Procedure(s) Performed: RIGHT BREAST LUMPECTOMY WITH RADIOACTIVE SEED LOCALIZATION (Right: Breast)     Patient location during evaluation: PACU Anesthesia Type: General Level of consciousness: awake and alert Pain management: pain level controlled Vital Signs Assessment: post-procedure vital signs reviewed and stable Respiratory status: spontaneous breathing, nonlabored ventilation, respiratory function stable and patient connected to nasal cannula oxygen Cardiovascular status: blood pressure returned to baseline and stable Postop Assessment: no apparent nausea or vomiting Anesthetic complications: no   No notable events documented.  Last Vitals:  Vitals:   02/12/21 1300 02/12/21 1338  BP: (!) 155/100 (!) 139/95  Pulse: 99 99  Resp: 19 18  Temp:  36.7 C  SpO2: 96% 95%    Last Pain:  Vitals:   02/12/21 1313  TempSrc:   PainSc: 2                  Catalina Gravel

## 2021-02-12 NOTE — Discharge Instructions (Addendum)
[place] Office Phone Number [phone]  BREAST BIOPSY/ PARTIAL MASTECTOMY: POST OP INSTRUCTIONS  Always review your discharge instruction sheet given to you by the facility where your surgery was performed.  IF YOU HAVE DISABILITY OR FAMILY LEAVE FORMS, YOU MUST BRING THEM TO THE OFFICE FOR PROCESSING.  DO NOT GIVE THEM TO YOUR DOCTOR.  A prescription for pain medication may be given to you upon discharge.  Take your pain medication as prescribed, if needed.  If narcotic pain medicine is not needed, then you may take acetaminophen (Tylenol) or ibuprofen (Advil) as needed. Take your usually prescribed medications unless otherwise directed If you need a refill on your pain medication, please contact your pharmacy.  They will contact our office to request authorization.  Prescriptions will not be filled after 5pm or on week-ends. You should eat very light the first 24 hours after surgery, such as soup, crackers, pudding, etc.  Resume your normal diet the day after surgery. Most patients will experience some swelling and bruising in the breast.  Ice packs and a good support bra will help.  Swelling and bruising can take several days to resolve.  It is common to experience some constipation if taking pain medication after surgery.  Increasing fluid intake and taking a stool softener will usually help or prevent this problem from occurring.  A mild laxative (Milk of Magnesia or Miralax) should be taken according to package directions if there are no bowel movements after 48 hours. Unless discharge instructions indicate otherwise, you may remove your bandages 24-48 hours after surgery, and you may shower at that time.  You may have steri-strips (small skin tapes) in place directly over the incision.  These strips should be left on the skin for 7-10 days.  If your surgeon used skin glue on the incision, you may shower in 24 hours.  The glue will flake off over the next 2-3 weeks.  Any  sutures or staples will be removed at the office during your follow-up visit. ACTIVITIES:  You may resume regular daily activities (gradually increasing) beginning the next day.  Wearing a good support bra or sports bra minimizes pain and swelling.  You may have sexual intercourse when it is comfortable. You may drive when you no longer are taking prescription pain medication, you can comfortably wear a seatbelt, and you can safely maneuver your car and apply brakes. RETURN TO WORK:  ______________________________________________________________________________________ Dennis Bast should see your doctor in the office for a follow-up appointment approximately two weeks after your surgery.  Your doctor's nurse will typically make your follow-up appointment when she calls you with your pathology report.  Expect your pathology report 2-3 business days after your surgery.  You may call to check if you do not hear from [place] after three days. OTHER INSTRUCTIONS: _______________________________________________________________________________________________ _____________________________________________________________________________________________________________________________________ _____________________________________________________________________________________________________________________________________ _____________________________________________________________________________________________________________________________________  WHEN TO CALL YOUR DOCTOR: Fever over 101.0 Nausea and/or vomiting. Extreme swelling or bruising. Continued bleeding from incision. Increased pain, redness, or drainage from the incision.  The clinic staff is available to answer your questions during regular business hours.  Please don't hesitate to call and ask to speak to one of the nurses for clinical concerns.  If you have a medical emergency, go to the nearest emergency room or call 911.  A surgeon from [facility] is always on call at the hospital.  For further questions, please visit [email or web address]     Post Anesthesia Home Care Instructions  Activity: Get plenty of rest for the remainder  of the day. A responsible individual must stay with you for 24 hours following the procedure.  For the next 24 hours, DO NOT: -Drive a car -Paediatric nurse -Drink alcoholic beverages -Take any medication unless instructed by your physician -Make any legal decisions or sign important papers.  Meals: Start with liquid foods such as gelatin or soup. Progress to regular foods as tolerated. Avoid greasy, spicy, heavy foods. If nausea and/or vomiting occur, drink only clear liquids until the nausea and/or vomiting subsides. Call your physician if vomiting continues.  Special Instructions/Symptoms: Your throat may feel dry or sore from the anesthesia or the breathing tube placed in your throat during surgery. If this causes discomfort, gargle with warm salt water. The discomfort should disappear within 24 hours.  If you had a scopolamine patch placed behind your ear for the management of post- operative nausea and/or vomiting:  1. The medication in the patch is effective for 72 hours, after which it should be removed.  Wrap patch in a tissue and discard in the trash. Wash hands thoroughly with soap and water. 2. You may remove the patch earlier than 72 hours if you experience unpleasant side effects which may include dry mouth, dizziness or visual disturbances. 3. Avoid touching the patch. Wash your hands with soap and water after contact with the patch.                                                                                               Can take Tylenol after 4pm for pain as needed.

## 2021-02-12 NOTE — Anesthesia Procedure Notes (Signed)
Procedure Name: LMA Insertion Date/Time: 02/12/2021 11:42 AM Performed by: Ezequiel Kayser, CRNA Pre-anesthesia Checklist: Patient identified, Emergency Drugs available, Suction available and Patient being monitored Patient Re-evaluated:Patient Re-evaluated prior to induction Oxygen Delivery Method: Circle System Utilized Preoxygenation: Pre-oxygenation with 100% oxygen Induction Type: IV induction Ventilation: Mask ventilation without difficulty LMA: LMA inserted LMA Size: 4.0 Number of attempts: 1 Airway Equipment and Method: Bite block Placement Confirmation: positive ETCO2 Tube secured with: Tape Dental Injury: Teeth and Oropharynx as per pre-operative assessment

## 2021-02-12 NOTE — Op Note (Signed)
Pre-op Diagnosis:  right breast intraductal papilloma Post-op Diagnosis: same Procedure:  Right radioactive seed localized lumpectomy Surgeon:  Dian Minahan K. Anesthesia:  GEN - LMA Indications: This is a 50 year old female who presents with a one year history of intermittent right nipple discharge.  Recently, she underwent work-up which revealed a right retroareolar mass at 3:00 7 x 4 x 4 mm in size.  Biopsy showed intraductal papilloma.  Description of procedure: The patient is brought to the operating room placed in supine position on the operating room table. After an adequate level of general anesthesia was obtained, her right breast was prepped with ChloraPrep and draped in sterile fashion. A timeout was taken to ensure the proper patient and proper procedure. We interrogated the breast with the neoprobe. We made a circumareolar incision around the medial side of the nipple after infiltrating with 0.25% Marcaine. Dissection was carried down in the breast tissue with cautery. We used the neoprobe to guide [place] towards the radioactive seed. We excised an area of tissue around the radioactive seed 2 cm in diameter. The specimen was removed and was oriented with a paint kit. Specimen mammogram showed the biopsy clip within the specimen. The radioactive seed was on the side of the specimen and was sent separately.  This was sent for pathologic examination. There is no residual radioactivity within the biopsy cavity. We inspected carefully for hemostasis. The wound was thoroughly irrigated. The wound was closed with a deep layer of 3-0 Vicryl and a subcuticular layer of 4-0 Monocryl. Benzoin Steri-Strips were applied. The patient was then extubated and brought to the recovery room in stable condition. All sponge, instrument, and needle counts are correct.  Imogene Burn. Georgette Dover, MD, [facility]  General/ Trauma Surgery  02/12/2021 12:30 PM

## 2021-02-12 NOTE — H&P (Signed)
Subjective    Chief Complaint: Breast Mass (Right breast intraductal papilloma)       History of Present Illness: Amy Barajas is a 50 y.o. female who is seen today as an office consultation at the request of Dr. Elly Modena for evaluation of Breast Mass (Right breast intraductal papilloma) .     This is a 50 year old female who presents with a one year history of intermittent right nipple discharge.  Recently, she underwent work-up which revealed a right retroareolar mass at 3:00 7 x 4 x 4 mm in size.  Biopsy showed intraductal papilloma.   The patient has a family history of breast cancer in her mother, as well as her 20 year old sister who is currently being treated for breast cancer.   Review of Systems: A complete review of systems was obtained from the patient.  I have reviewed this information and discussed as appropriate with the patient.  See HPI as well for other ROS.   Review of Systems  Constitutional: Negative.   HENT: Negative.   Eyes: Negative.   Respiratory: Negative.   Cardiovascular: Negative.   Gastrointestinal: Negative.   Genitourinary: Negative.   Musculoskeletal: Negative.   Skin: Negative.   Neurological: Negative.   Endo/Heme/Allergies: Negative.   Psychiatric/Behavioral: Negative.         Medical History:        Past Medical History:  Diagnosis Date   Asthma, unspecified asthma severity, unspecified whether complicated, unspecified whether persistent     GERD (gastroesophageal reflux disease)     Hypertension             Patient Active Problem List  Diagnosis   Intraductal papilloma of breast, right               Past Surgical History:  Procedure Laterality Date   CESAREAN SECTION N/A     CHOLECYSTECTOMY OPEN N/A        No Known Allergies              Current Outpatient Medications on File Prior to Visit  Medication Sig Dispense Refill   amLODIPine (NORVASC) 10 MG tablet Take 10 mg by mouth once daily        No current  facility-administered medications on file prior to visit.               Family History  Problem Relation Age of Onset   High blood pressure (Hypertension) Mother     Hyperlipidemia (Elevated cholesterol) Mother     Breast cancer Mother     Diabetes Father     Breast cancer Sister        Social History         Tobacco Use  Smoking Status Never Smoker  Smokeless Tobacco Never Used      Social History           Socioeconomic History   Marital status: Married  Tobacco Use   Smoking status: Never Smoker   Smokeless tobacco: Never Used  Scientific laboratory technician Use: Never used  Substance and Sexual Activity   Alcohol use: Never   Drug use: Never      Objective:                BP: 110/64  Pulse: 61  Temp: 36.9 C (98.4 F)  SpO2: 96%  Weight: (!) 108.5 kg (239 lb 3.2 oz)  Height: 176.5 cm (5' 9.5")    Body mass index  is 34.82 kg/m.   Physical Exam    Constitutional:  WDWN in NAD, conversant, no obvious deformities; lying in bed comfortably Eyes:  Pupils equal, round; sclera anicteric; moist conjunctiva; no lid lag HENT:  Oral mucosa moist; good dentition  Neck:  No masses palpated, trachea midline; no thyromegaly Lungs:  CTA bilaterally; normal respiratory effort Breasts:  symmetric, no nipple changes; no visible discharge today; no palpable masses or lymphadenopathy on either side CV:  Regular rate and rhythm; no murmurs; extremities well-perfused with no edema Abd:  +bowel sounds, soft, non-tender, no palpable organomegaly; no palpable hernias Musc:  Unable to assess gait; no apparent clubbing or cyanosis in extremities Lymphatic:  No palpable cervical or axillary lymphadenopathy Skin:  Warm, dry; no sign of jaundice Psychiatric - alert and oriented x 4; calm mood and affect     Labs, Imaging and Diagnostic Testing: Diagnosis Breast, right, needle core biopsy, 3 o'clock - INTRADUCTAL PAPILLOMA - NO ATYPIA OR MALIGNANCY IDENTIFIED Microscopic Comment Dr.  Vic Ripper reviewed the case and agrees with the above diagnosis. These results were called to [facility] on November 28, 2020. Thressa Sheller MD Pathologist, Electronic Signature (Case signed 11/28/2020)   CLINICAL DATA:  Clear spontaneous nipple RIGHT discharge for 1 year.   EXAM: DIGITAL DIAGNOSTIC BILATERAL MAMMOGRAM WITH TOMOSYNTHESIS AND CAD; ULTRASOUND RIGHT BREAST LIMITED   TECHNIQUE: Bilateral digital diagnostic mammography and breast tomosynthesis was performed. The images were evaluated with computer-aided detection.; Targeted ultrasound examination of the right breast was performed   COMPARISON:  None.   ACR Breast Density Category c: The breast tissue is heterogeneously dense, which may obscure small masses.   FINDINGS: No suspicious mass, distortion, or microcalcifications are identified to suggest presence of malignancy.   Targeted ultrasound is performed, showing a mildly dilated duct in the immediate retroareolar region of the RIGHT breast which contains a filling defect. Intraductal mass measures 0.7 x 0.4 x 0.4 centimeters. There is minimal internal blood flow associated with the mass.   Evaluation of the RIGHT axilla is negative for adenopathy.   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   RECOMMENDATION: Recommend ultrasound-guided core biopsy of the RIGHT breast.   I have discussed the findings and recommendations with the patient. If applicable, a reminder letter will be sent to the patient regarding the next appointment.   BI-RADS CATEGORY  4: Suspicious.     Electronically Signed   By: Nolon Nations M.D.   On: 11/13/2020 14:12   CLINICAL DATA:  Patient with indeterminate right breast mass   EXAM: ULTRASOUND GUIDED RIGHT BREAST CORE NEEDLE BIOPSY   COMPARISON:  Previous exam(s).   PROCEDURE: I met with the patient and  we discussed the procedure of ultrasound-guided biopsy, including benefits and alternatives. We discussed the high likelihood of a successful procedure. We discussed the risks of the procedure, including infection, bleeding, tissue injury, clip migration, and inadequate sampling. Informed written consent was given. The usual time-out protocol was performed immediately prior to the procedure.   Lesion quadrant: Upper inner quadrant   Using sterile technique and 1% Lidocaine as local anesthetic, under direct ultrasound visualization, a 14 gauge spring-loaded device was used to perform biopsy of right breast mass 3 o'clock position using a medial approach. At the conclusion of the procedure ribbon tissue marker clip was deployed into the biopsy cavity. Follow up 2 view mammogram was performed and dictated separately.  IMPRESSION: Ultrasound guided biopsy of right breast mass 3 o'clock position. No apparent complications.   Electronically Signed: By: Lovey Newcomer M.D. On: 11/27/2020 14:22   Assessment and Plan:  Diagnoses and all orders for this visit:   Intraductal papilloma of breast, right     Right breast seed localized lumpectomy.  The surgical procedure has been discussed with the patient.  Potential risks, benefits, alternative treatments, and expected outcomes have been explained.  All of the patient's questions at this time have been answered.  The likelihood of reaching the patient's treatment goal is good.  The patient understand the proposed surgical procedure and wishes to proceed.    Imogene Burn. Georgette Dover, MD, [facility]  General Surgery   02/12/2021 10:59 AM

## 2021-02-12 NOTE — Anesthesia Preprocedure Evaluation (Addendum)
Anesthesia Evaluation  Patient identified by MRN, date of birth, ID band Patient awake    Reviewed: Allergy & Precautions, NPO status , Patient's Chart, lab work & pertinent test results  History of Anesthesia Complications (+) Family history of anesthesia reaction and history of anesthetic complications  Airway Mallampati: III  TM Distance: >3 FB Neck ROM: Full    Dental  (+) Teeth Intact, Dental Advisory Given, Chipped   Pulmonary asthma ,    Pulmonary exam normal breath sounds clear to auscultation       Cardiovascular hypertension, Pt. on medications  Rhythm:Regular Rate:Tachycardia     Neuro/Psych negative neurological ROS  negative psych ROS   GI/Hepatic Neg liver ROS, GERD  Medicated and Controlled,  Endo/Other  Obesity   Renal/GU negative Renal ROS     Musculoskeletal negative musculoskeletal ROS (+)   Abdominal   Peds  Hematology negative hematology ROS (+)   Anesthesia Other Findings Day of surgery medications reviewed with the patient.  Reproductive/Obstetrics                            Anesthesia Physical Anesthesia Plan  ASA: 2  Anesthesia Plan: General   Post-op Pain Management:    Induction: Intravenous  PONV Risk Score and Plan: 3 and Midazolam, Dexamethasone and Ondansetron  Airway Management Planned: LMA  Additional Equipment:   Intra-op Plan:   Post-operative Plan: Extubation in OR  Informed Consent: I have reviewed the patients History and Physical, chart, labs and discussed the procedure including the risks, benefits and alternatives for the proposed anesthesia with the patient or authorized representative who has indicated his/her understanding and acceptance.     Dental advisory given  Plan Discussed with: CRNA  Anesthesia Plan Comments:         Anesthesia Quick Evaluation

## 2021-02-12 NOTE — Transfer of Care (Signed)
Immediate Anesthesia Transfer of Care Note  Patient: Amy Barajas  Procedure(s) Performed: RIGHT BREAST LUMPECTOMY WITH RADIOACTIVE SEED LOCALIZATION (Right: Breast)  Patient Location: PACU  Anesthesia Type:General  Level of Consciousness: drowsy  Airway & Oxygen Therapy: Patient Spontanous Breathing and Patient connected to face mask oxygen  Post-op Assessment: Report given to RN and Post -op Vital signs reviewed and stable  Post vital signs: Reviewed and stable  Last Vitals:  Vitals Value Taken Time  BP    Temp    Pulse 115 02/12/21 1231  Resp 24 02/12/21 1231  SpO2 99 % 02/12/21 1231  Vitals shown include unvalidated device data.  Last Pain:  Vitals:   02/12/21 1001  TempSrc: Oral  PainSc: 0-No pain      Patients Stated Pain Goal: 3 ([id number])  Complications: No notable events documented.

## 2021-02-13 LAB — SURGICAL PATHOLOGY

## 2021-02-13 NOTE — Progress Notes (Signed)
No message left

## 2021-02-14 ENCOUNTER — Encounter (HOSPITAL_BASED_OUTPATIENT_CLINIC_OR_DEPARTMENT_OTHER): Payer: Self-pay | Admitting: Surgery

## 2021-06-27 ENCOUNTER — Ambulatory Visit: Payer: Self-pay | Admitting: *Deleted

## 2021-06-27 NOTE — Telephone Encounter (Signed)
Due sx's stated, advised ED. Patient advise to call office for appt. For f/u

## 2021-06-27 NOTE — Telephone Encounter (Signed)
°  Chief Complaint: chest discomfort since Friday Symptoms: "cramping" in right chest area started on Friday 06/21/21 now left chest pain under breast lasting longer than 5 minutes easing off now . Right leg hurts thigh / calf . Friday felt dizzy  N/V Frequency: since Friday 06/21/21 for right chest and today 5 minutes ago for left chest under breast  Pertinent Negatives: Patient denies chest pain now or difficulty breathing.  Disposition: [x] ED /[x] Urgent Care (no appt availability in office) / [] Appointment(In office/virtual)/ []  [facility]/ [] Home Care/ [] Refused Recommended Disposition /[] [facility]/ []  Follow-up with PCP Additional Notes:  Recommended PCE walk in as well. Patient reports if pain worsens she will go to ED.         Reason for Disposition  [1] Chest pain lasts > 5 minutes AND [2] occurred > 3 days ago (72 hours) AND [3] NO chest pain or cardiac symptoms now  Answer Assessment - Initial Assessment Questions 1. LOCATION: "Where does it hurt?"        Cramping under left breast  Friday cramping in right breast s/p surgery right breast mass 2. RADIATION: "Does the pain go anywhere else?" (e.g., into neck, jaw, arms, back)     No  3. ONSET: "When did the chest pain begin?" (Minutes, hours or days)      Greater than 5 minutes  4. PATTERN "Does the pain come and go, or has it been constant since it started?"  "Does it get worse with exertion?"      Comes and goes some with exertion 5. DURATION: "How long does it last" (e.g., seconds, minutes, hours)     Sometimes longer than 5 minutes  6. SEVERITY: "How bad is the pain?"  (e.g., Scale 1-10; mild, moderate, or severe)    - MILD (1-3): doesn't interfere with normal activities     - MODERATE (4-7): interferes with normal activities or awakens from sleep    - SEVERE (8-10): excruciating pain, unable to do any normal activities       Moderate "feels like charlie horse in leg"  7. CARDIAC RISK FACTORS: "Do  you have any history of heart problems or risk factors for heart disease?" (e.g., angina, prior heart attack; diabetes, high blood pressure, high cholesterol, smoker, or strong family history of heart disease)     Hx asthma hx surgery from right breast mass 8. PULMONARY RISK FACTORS: "Do you have any history of lung disease?"  (e.g., blood clots in lung, asthma, emphysema, birth control pills)     na 9. CAUSE: "What do you think is causing the chest pain?"     Not sure  10. OTHER SYMPTOMS: "Do you have any other symptoms?" (e.g., dizziness, nausea, vomiting, sweating, fever, difficulty breathing, cough)       Friday felt dizzy, N/V  11. PREGNANCY: "Is there any chance you are pregnant?" "When was your last menstrual period?"       na  Protocols used: Chest Pain-A-AH

## 2021-08-06 ENCOUNTER — Other Ambulatory Visit: Payer: Self-pay | Admitting: Nurse Practitioner

## 2021-08-06 ENCOUNTER — Other Ambulatory Visit: Payer: Self-pay

## 2021-08-06 DIAGNOSIS — J45909 Unspecified asthma, uncomplicated: Secondary | ICD-10-CM

## 2021-08-07 NOTE — Telephone Encounter (Signed)
Requested medications are due for refill today.  unsure ? ?Requested medications are on the active medications list.  Albuterol - yes, Lotrisone - no ? ?Last refill. Albuterol 03/14/2020 #18 1 refill , Lotrisone 03/14/2020 ? ?Future visit scheduled.   yes ? ?Notes to clinic.  Rx for albuterol expired 03/14/2021.  Lotrisone was d/c'd 09/26/2020 - and is a non delegated refill. ? ? ? ?Requested Prescriptions  ?Pending Prescriptions Disp Refills  ? albuterol (VENTOLIN HFA) 108 (90 Base) MCG/ACT inhaler 18 g 1  ?  Sig: INHALE 1-2 PUFFS INTO THE LUNGS EVERY 6 (SIX) HOURS AS NEEDED FOR WHEEZING OR SHORTNESS OF BREATH.  ?  ? Pulmonology:  Beta Agonists 2 Failed - 08/06/2021 11:44 AM  ?  ?  Failed - Last BP in normal range  ?  BP Readings from Last 1 Encounters:  ?02/12/21 (!) 139/95  ?  ?  ?  ?  Failed - Valid encounter within last 12 months  ?  Recent Outpatient Visits   ? ?      ? 1 year ago Essential hypertension  ? [place], [place] W, NP  ? 4 years ago Essential hypertension  ? [place], [place], [place]  ? 6 years ago Essential hypertension  ? [place], NP  ? 7 years ago Essential hypertension  ? [place], NP  ? ?  ?  ?Future Appointments   ? ?        ? In 2 weeks Ladell Pier, MD [place]  ? ?  ? ?  ?  ?  Passed - Last Heart Rate in normal range  ?  Pulse Readings from Last 1 Encounters:  ?02/12/21 99  ?  ?  ?  ?  ? clotrimazole-betamethasone (LOTRISONE) cream 60 g 1  ?  Sig: APPLY 1 APPLICATION TOPICALLY 2 (TWO) TIMES DAILY.  ?  ? Off-Protocol Failed - 08/06/2021 11:44 AM  ?  ?  Failed - Medication not assigned to a protocol, review manually.  ?  ?  Failed - Valid encounter within last 12 months  ?  Recent Outpatient Visits   ? ?      ? 1 year ago Essential hypertension  ? [place], [place] W, NP  ? 4 years ago Essential hypertension  ? [place], [place], [place]  ? 6 years ago Essential hypertension  ? [place], NP  ? 7 years ago Essential hypertension  ? [place], NP  ? ?  ?  ?Future Appointments   ? ?        ? In 2 weeks Ladell Pier, MD [place]  ? ?  ? ?  ?  ?  ?  ?

## 2021-08-12 ENCOUNTER — Other Ambulatory Visit: Payer: Self-pay

## 2021-08-26 ENCOUNTER — Ambulatory Visit: Payer: No Typology Code available for payment source | Attending: Internal Medicine | Admitting: Internal Medicine

## 2021-08-26 ENCOUNTER — Encounter: Payer: Self-pay | Admitting: Internal Medicine

## 2021-08-26 ENCOUNTER — Other Ambulatory Visit: Payer: Self-pay

## 2021-08-26 VITALS — BP 158/114 | HR 86 | Wt 247.4 lb

## 2021-08-26 DIAGNOSIS — K219 Gastro-esophageal reflux disease without esophagitis: Secondary | ICD-10-CM

## 2021-08-26 DIAGNOSIS — I10 Essential (primary) hypertension: Secondary | ICD-10-CM

## 2021-08-26 DIAGNOSIS — J4530 Mild persistent asthma, uncomplicated: Secondary | ICD-10-CM

## 2021-08-26 DIAGNOSIS — N1831 Chronic kidney disease, stage 3a: Secondary | ICD-10-CM

## 2021-08-26 MED ORDER — FAMOTIDINE 20 MG PO TABS
20.0000 mg | ORAL_TABLET | Freq: Two times a day (BID) | ORAL | 2 refills | Status: DC
Start: 2021-08-26 — End: 2023-09-02
  Filled 2021-08-26 – 2021-08-27 (×2): qty 60, 30d supply, fill #0

## 2021-08-26 MED ORDER — HYDROCHLOROTHIAZIDE 12.5 MG PO TABS
12.5000 mg | ORAL_TABLET | Freq: Every day | ORAL | 2 refills | Status: DC
  Filled 2021-08-26: qty 30, 30d supply, fill #0
  Filled 2021-11-18 – 2021-11-26 (×2): qty 30, 30d supply, fill #1

## 2021-08-26 MED ORDER — VALSARTAN 40 MG PO TABS
40.0000 mg | ORAL_TABLET | Freq: Every day | ORAL | 2 refills | Status: DC
Start: 2021-08-26 — End: 2023-09-02
  Filled 2021-08-26: qty 30, 30d supply, fill #0
  Filled 2021-11-18 – 2021-11-26 (×2): qty 30, 30d supply, fill #1

## 2021-08-26 MED ORDER — ALBUTEROL SULFATE HFA 108 (90 BASE) MCG/ACT IN AERS
2.0000 | INHALATION_SPRAY | Freq: Four times a day (QID) | RESPIRATORY_TRACT | 3 refills | Status: DC | PRN
  Filled 2021-08-26: qty 18, 30d supply, fill #0

## 2021-08-26 MED ORDER — BUDESONIDE-FORMOTEROL FUMARATE 80-4.5 MCG/ACT IN AERO
2.0000 | INHALATION_SPRAY | Freq: Two times a day (BID) | RESPIRATORY_TRACT | 3 refills | Status: DC
  Filled 2021-08-26: qty 10.2, 25d supply, fill #0

## 2021-08-26 NOTE — Progress Notes (Addendum)
Patient ID: Amy Barajas, female    DOB: 1971-01-10  MRN: [id number]  CC: Hypertension and med refill  Subjective: Amy Barajas is a 51 y.o. female who presents for medication refills.  PCP is NP Bertram Denver. Her concerns today include:  Pt with hx of HTN, asthma  Patient initially requested to reschedule the visit because her daughter was in the emergency room with a sickle cell crisis.  I noted that her blood pressure was quite elevated so I requested that she at least let me address that before she leaves.  She was agreeable to this.  HTN: Blood pressure significantly elevated today.  She self stopped Norvasc 1 month ago because it was causing headaches every time she took it.  Not limiting salt in the foods.  Denies any headaches, dizziness or chest pains.  Does have some shortness of breath but attributes that to asthma.  GERD: Requests refill on Pepcid.  She has been out of it for a while.  Reports acid reflux with juices.  Also endorses acid reflux with tomato based foods.  Asthma: Requests refill on albuterol.  Had an asthma attack 3 weeks ago and had called for refill but states she was told she had to be seen.  She has been using a relatives inhaler.  Find that she is having to use albuterol more over the past 3 weeks at least about twice a week.  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  Sometimes she has flareup of asthma from the dust.  I note that on previous chemistry she has stage III CKD.  She was not aware of this.  She endorses using BC powder frequently sometimes for her knees.  Patient Active Problem List   Diagnosis Date Noted   Gastroesophageal reflux disease without esophagitis 08/26/2021   Stage 3a chronic kidney disease (HCC) 08/26/2021   Mild asthma without complication 08/11/2018   Health care maintenance 08/11/2018   Essential hypertension 11/08/2014     No current outpatient medications on file prior to visit.   No current  facility-administered medications on file prior to visit.    No Known Allergies  Social History   Socioeconomic History   Marital status: Married    Spouse name: Not on file   Number of children: 8   Years of education: Not on file   Highest education level: Some college, no degree  Occupational History   Not on file  Tobacco Use   Smoking status: Never   Smokeless tobacco: Never  Vaping Use   Vaping Use: Never used  Substance and Sexual Activity   Alcohol use: No   Drug use: No   Sexual activity: Not Currently    Birth control/protection: Surgical  Other Topics Concern   Not on file  Social History Narrative   Not on file   Social Determinants of Health   Financial Resource Strain: Not on file  Food Insecurity: Not on file  Transportation Needs: No Transportation Needs   Lack of Transportation (Medical): No   Lack of Transportation (Non-Medical): No  Physical Activity: Not on file  Stress: Not on file  Social Connections: Not on file  Intimate Partner Violence: Not on file    Family History  Problem Relation Age of Onset   Hypertension Mother    Hyperlipidemia Mother    Diabetes Father    Breast cancer Sister    Hypertension Maternal Grandmother    Breast cancer Maternal Grandmother     Past  Surgical History:  Procedure Laterality Date   BREAST LUMPECTOMY WITH RADIOACTIVE SEED LOCALIZATION Right 02/12/2021   Procedure: RIGHT BREAST LUMPECTOMY WITH RADIOACTIVE SEED LOCALIZATION;  Surgeon: Manus Rudd, MD;  Location: [facility];  Service: General;  Laterality: Right;   CESAREAN SECTION  1989   CHOLECYSTECTOMY  1994   TUBAL LIGATION  1998    ROS: Review of Systems Negative except as stated above  PHYSICAL EXAM: BP (!) 158/114   Pulse 86   Wt 247 lb 6.4 oz (112.2 kg)   LMP 10/04/2015 (Approximate)   SpO2 100%   BMI 36.01 kg/m   Physical Exam  General appearance - alert, well appearing, and in no distress Mental status - normal  mood, behavior, speech, dress, motor activity, and thought processes Chest - clear to auscultation, no wheezes, rales or rhonchi, symmetric air entry Heart - normal rate, regular rhythm, normal S1, S2, no murmurs, rubs, clicks or gallops Extremities - peripheral pulses normal, no pedal edema, no clubbing or cyanosis      Latest Ref Rng & Units 09/13/2017    7:06 PM 01/01/2017    2:00 PM 12/29/2016   11:06 AM  CMP  Glucose 65 - 99 mg/dL 99   99   94    BUN 6 - 20 mg/dL 10   11   15     Creatinine 0.44 - 1.00 mg/dL 3.47   4.25   9.56    Sodium 135 - 145 mmol/L 138   142   136    Potassium 3.5 - 5.1 mmol/L 3.4   4.3   5.5    Chloride 101 - 111 mmol/L 103   106   105    CO2 22 - 32 mmol/L 26   21   23     Calcium 8.9 - 10.3 mg/dL 9.1   9.4   8.9     Lipid Panel  No results found for: CHOL, TRIG, HDL, CHOLHDL, VLDL, LDLCALC, LDLDIRECT  CBC    Component Value Date/Time   WBC 12.4 (H) 09/13/2017 1906   RBC 4.12 09/13/2017 1906   HGB 11.0 (L) 09/13/2017 1906   HCT 33.5 (L) 09/13/2017 1906   PLT 382 09/13/2017 1906   MCV 81.3 09/13/2017 1906   MCH 26.7 09/13/2017 1906   MCHC 32.8 09/13/2017 1906   RDW 15.5 09/13/2017 1906   LYMPHSABS 4.7 (H) 09/13/2017 1906   MONOABS 0.4 09/13/2017 1906   EOSABS 0.3 09/13/2017 1906   BASOSABS 0.0 09/13/2017 1906    ASSESSMENT AND PLAN: 1. Essential hypertension Not at goal which is 130/80 or lower.  Discussed health risks associated with elevated blood pressure including cardiovascular risks and damage to the kidneys over time.  She is intolerant of Norvasc reporting headaches with the medicine.  We will put her on Diovan and HCTZ instead.  Follow-up with clinical pharmacist in a few weeks for repeat blood pressure check.  Check chemistry today. -DASH diet encouraged. - valsartan (DIOVAN) 40 MG tablet; Take 1 tablet (40 mg total) by mouth daily.  Dispense: 30 tablet; Refill: 2 - hydrochlorothiazide (HYDRODIURIL) 12.5 MG tablet; Take 1 tablet (12.5 mg  total) by mouth daily.  Dispense: 30 tablet; Refill: 2 - Comprehensive metabolic panel  2. Mild persistent asthma without complication Continue albuterol to use as needed..  We will add Symbicort as a controller. - albuterol (VENTOLIN HFA) 108 (90 Base) MCG/ACT inhaler; Inhale 2 puffs into the lungs every 6 (six) hours as needed for wheezing or shortness  of breath.  Dispense: 18 g; Refill: 3 - budesonide-formoterol (SYMBICORT) 80-4.5 MCG/ACT inhaler; Inhale 2 puffs into the lungs 2 (two) times daily.  Dispense: 10.2 g; Refill: 3  3. Gastroesophageal reflux disease without esophagitis GERD precautions discussed.  Advised to avoid certain foods like spicy foods, tomato-based foods, juices and excessive caffeine.  Advised to eat his last meal at least 2 to 3 hours before laying down at nights and to sleep with his head slightly elevated.   Refill Pepcid  4. Stage 3a chronic kidney disease (HCC) Advised to stop BC powder and any over-the-counter NSAID.  Use Tylenol instead for the knees.     Patient was given the opportunity to ask questions.  Patient verbalized understanding of the plan and was able to repeat key elements of the plan.   This documentation was completed using Paediatric nurse.  Any transcriptional errors are unintentional.  Orders Placed This Encounter  Procedures   Comprehensive metabolic panel     Requested Prescriptions   Signed Prescriptions Disp Refills   valsartan (DIOVAN) 40 MG tablet 30 tablet 2    Sig: Take 1 tablet (40 mg total) by mouth daily.   hydrochlorothiazide (HYDRODIURIL) 12.5 MG tablet 30 tablet 2    Sig: Take 1 tablet (12.5 mg total) by mouth daily.   albuterol (VENTOLIN HFA) 108 (90 Base) MCG/ACT inhaler 18 g 3    Sig: Inhale 2 puffs into the lungs every 6 (six) hours as needed for wheezing or shortness of breath.   famotidine (PEPCID) 20 MG tablet 60 tablet 2    Sig: Take 1 tablet (20 mg total) by mouth 2 (two) times daily.    budesonide-formoterol (SYMBICORT) 80-4.5 MCG/ACT inhaler 10.2 g 3    Sig: Inhale 2 puffs into the lungs 2 (two) times daily.    Return in about 1 month (around 09/26/2021) for f/u in 1 mth with Bertram Denver.  Give f/u with Franky Macho in 2 wks for BP check.  Jonah Blue, MD, FACP

## 2021-08-26 NOTE — Progress Notes (Signed)
Pt states that HTN medication make her head hurt ?

## 2021-08-26 NOTE — Patient Instructions (Addendum)
Your blood pressure is not at goal of 130/80 or lower ?Start Diovan 40 mg and Hydrochlorothiazide 12.5 mg daily for blood pressure. ?Your kidney function is not 100%.  Took BC Powder.  Okay to use Tylenol. ? ?

## 2021-08-27 ENCOUNTER — Other Ambulatory Visit: Payer: Self-pay

## 2021-08-27 LAB — COMPREHENSIVE METABOLIC PANEL
ALT: 15 IU/L (ref 0–32)
AST: 23 IU/L (ref 0–40)
Albumin/Globulin Ratio: 1.4 (ref 1.2–2.2)
Albumin: 4.5 g/dL (ref 3.8–4.9)
Alkaline Phosphatase: 99 IU/L (ref 44–121)
BUN/Creatinine Ratio: 11 (ref 9–23)
BUN: 18 mg/dL (ref 6–24)
Bilirubin Total: 0.8 mg/dL (ref 0.0–1.2)
CO2: 20 mmol/L (ref 20–29)
Calcium: 9.4 mg/dL (ref 8.7–10.2)
Chloride: 107 mmol/L — ABNORMAL HIGH (ref 96–106)
Creatinine, Ser: 1.58 mg/dL — ABNORMAL HIGH (ref 0.57–1.00)
Globulin, Total: 3.3 g/dL (ref 1.5–4.5)
Glucose: 92 mg/dL (ref 70–99)
Potassium: 4.5 mmol/L (ref 3.5–5.2)
Sodium: 142 mmol/L (ref 134–144)
Total Protein: 7.8 g/dL (ref 6.0–8.5)
eGFR: 39 mL/min/{1.73_m2} — ABNORMAL LOW (ref >59)

## 2021-08-28 ENCOUNTER — Other Ambulatory Visit: Payer: Self-pay

## 2021-08-29 ENCOUNTER — Other Ambulatory Visit: Payer: Self-pay

## 2021-09-05 ENCOUNTER — Other Ambulatory Visit: Payer: Self-pay

## 2021-09-09 ENCOUNTER — Other Ambulatory Visit: Payer: Self-pay

## 2021-09-12 ENCOUNTER — Ambulatory Visit: Payer: No Typology Code available for payment source | Admitting: Pharmacist

## 2021-09-17 ENCOUNTER — Other Ambulatory Visit: Payer: Self-pay

## 2021-09-18 ENCOUNTER — Encounter (HOSPITAL_COMMUNITY): Payer: Self-pay

## 2021-09-27 ENCOUNTER — Ambulatory Visit: Payer: No Typology Code available for payment source | Admitting: Nurse Practitioner

## 2021-10-22 ENCOUNTER — Other Ambulatory Visit: Payer: Self-pay

## 2021-10-22 MED ORDER — AMOXICILLIN 875 MG PO TABS
875.0000 mg | ORAL_TABLET | Freq: Two times a day (BID) | ORAL | 0 refills | Status: DC
Start: 2021-10-22 — End: 2021-11-19
  Filled 2021-10-22: qty 20, 10d supply, fill #0

## 2021-10-29 ENCOUNTER — Telehealth: Payer: Self-pay | Admitting: Pharmacist

## 2021-10-29 NOTE — Telephone Encounter (Signed)
Patient attempted to be outreached by Park Liter, PharmD Candidate on 10/25/21 to discuss hypertension.   Left voicemail for patient to return our call at her convenience.    Catie Hedwig [place], PharmD, [facility] [phone]

## 2021-11-18 ENCOUNTER — Other Ambulatory Visit: Payer: Self-pay

## 2021-11-19 ENCOUNTER — Other Ambulatory Visit: Payer: Self-pay | Admitting: Pharmacist

## 2021-11-19 NOTE — Patient Instructions (Signed)
Valisa,   It was great talking to you today!  Check your blood pressure once daily, and any time you have concerning symptoms like headache, chest pain, dizziness, shortness of breath, or vision changes.   Our goal is less than 130/80.  To appropriately check your blood pressure, make sure you do the following:  1) Avoid caffeine, exercise, or tobacco products for 30 minutes before checking. Empty your bladder. 2) Sit with your back supported in a flat-backed chair. Rest your arm on something flat (arm of the chair, table, etc). 3) Sit still with your feet flat on the floor, resting, for at least 5 minutes.  4) Check your blood pressure. Take 1-2 readings.  5) Write down these readings and bring with you to any provider appointments.  Bring your home blood pressure machine with you to a provider's office for accuracy comparison at least once a year.   Make sure you take your blood pressure medications before you come to any office visit, even if you were asked to fast for labs.  Take care!  Catie Hedwig Morton, PharmD, [facility] [phone]

## 2021-11-19 NOTE — Chronic Care Management (AMB) (Signed)
Patient appearing on report for [facility] - Hypertension Control report due to last documented ambulatory blood pressure of 158/114 on 08/26/21. Next appointment with PCP is not scheduled   Outreached patient to discuss hypertension control and medication management.   Current antihypertensives: valsartan 40 mg, HCTZ 12.5 mg daily  Patient does not have an automated upper arm home BP machine. Counseled on appropriate brands to purchase. She said her mother might have one she could borrow. Encouraged to bring to office for accuracy comparison  Reports she had not been adherent to antihypertensives until recently when she had a dental procedure and they were initially unable to do the procedure due to her elevated blood pressure.   Patient denies hypotensive signs and symptoms including dizziness, lightheadedness.  Patient denies hypertensive symptoms including headache, chest pain, shortness of breath.  Patient denies side effects related to antihypertensives.     Assessment/Plan: - Currently uncontrolled - - Reviewed goal blood pressure <130/80 - Reviewed appropriate administration of medication regimen - Counseled on long term microvascular and macrovascular complications of uncontrolled hypertension - Reviewed appropriate home BP monitoring technique (avoid caffeine, smoking, and exercise for 30 minutes before checking, rest for at least 5 minutes before taking BP, sit with feet flat on the floor and back against a hard surface, uncross legs, and rest arm on flat surface) - Reviewed to check blood pressure periodically, document, and provide at next provider visit  Assisted in scheduling follow up with Geryl Rankins in 2 weeks. Encouraged adherence until then.   I also provided counseling on Symbicort inhaler.   Catie Hedwig Morton, PharmD, [facility] [phone]

## 2021-11-25 ENCOUNTER — Other Ambulatory Visit: Payer: Self-pay

## 2021-11-26 ENCOUNTER — Other Ambulatory Visit: Payer: Self-pay

## 2021-12-02 ENCOUNTER — Ambulatory Visit: Payer: Self-pay | Admitting: Nurse Practitioner

## 2022-01-15 ENCOUNTER — Telehealth: Payer: Self-pay | Admitting: Pharmacist

## 2022-01-15 NOTE — Telephone Encounter (Signed)
Patient attempted to be outreached by Berdie Ogren, PharmD Candidate on 01/15/2022 to discuss hypertension. Left voicemail for patient to return our call at their convenience.   Catie Hedwig Morton, PharmD, [facility]  [phone]

## 2022-03-04 ENCOUNTER — Other Ambulatory Visit: Payer: Self-pay

## 2022-04-23 IMAGING — US US BREAST*R* LIMITED INC AXILLA
1 series · 12 of 12 positions shown · non-contrast
Comparison: None.

CLINICAL DATA: Clear spontaneous nipple RIGHT discharge for 1 year.

EXAM:
DIGITAL DIAGNOSTIC BILATERAL MAMMOGRAM WITH TOMOSYNTHESIS AND CAD;
ULTRASOUND RIGHT BREAST LIMITED
TECHNIQUE: Bilateral digital diagnostic mammography and breast tomosynthesis
was performed. The images were evaluated with computer-aided
detection.; Targeted ultrasound examination of the right breast was
performed

[Series 1: us breast*right* limited inc axilla · 0.06mm/px · 12 of 12 slices shown]
[im 1/12]
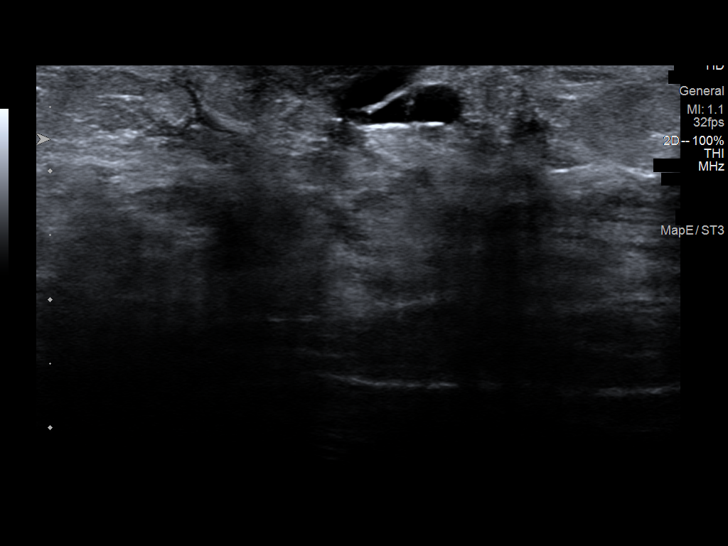
[im 2/12]
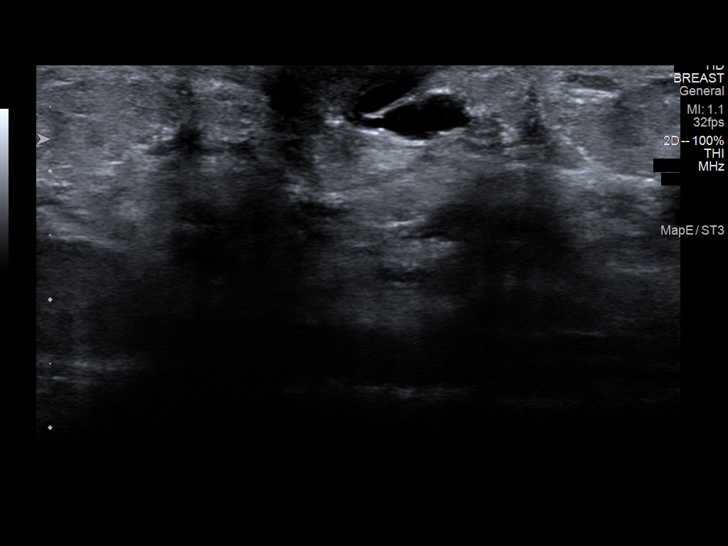
[im 3/12]
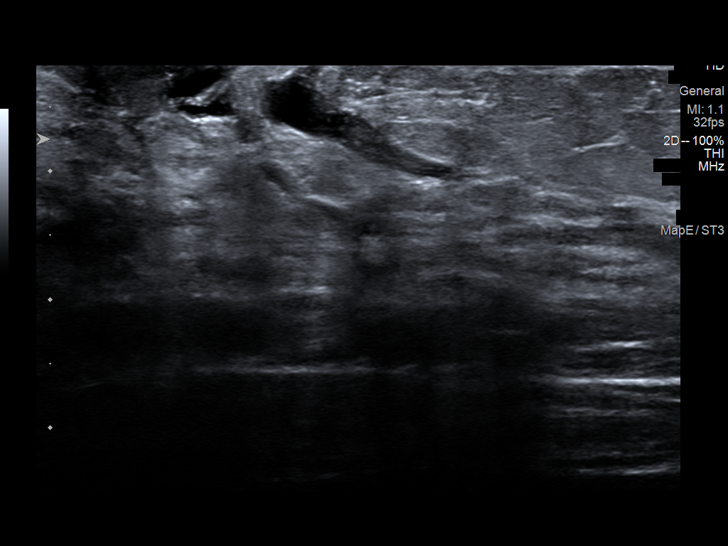
[im 4/12]
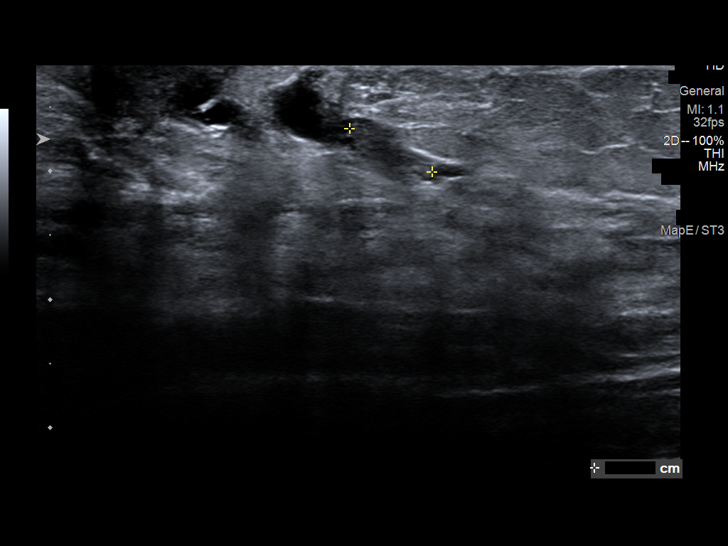
[im 5/12]
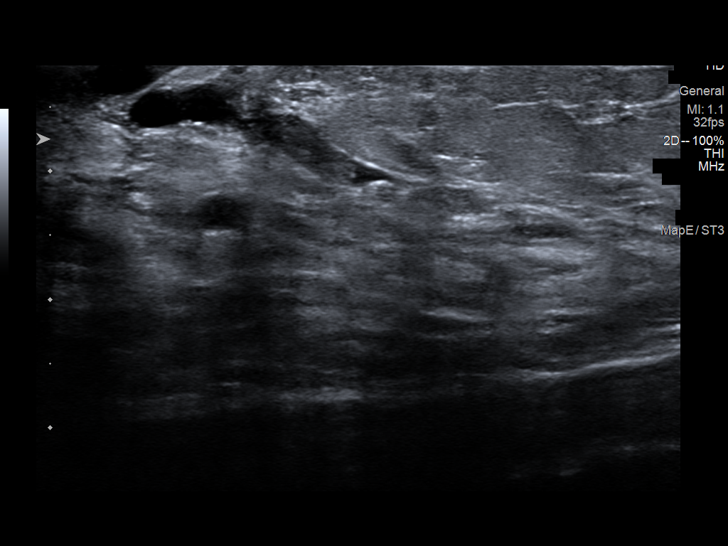
[im 6/12]
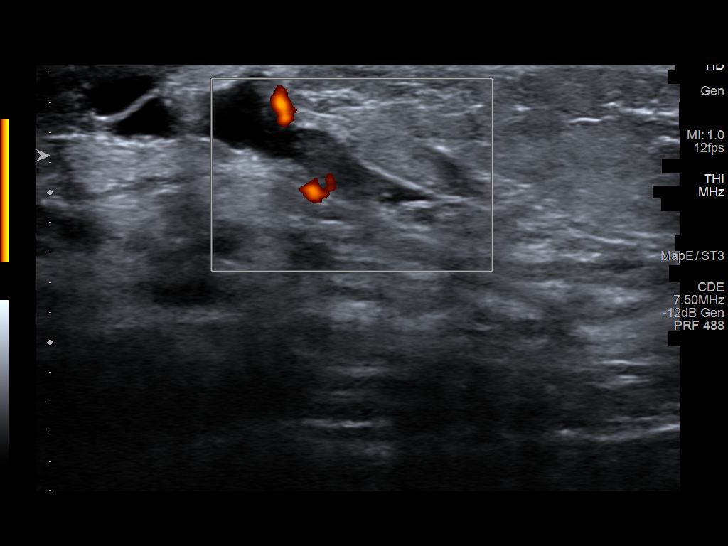
[im 7/12]
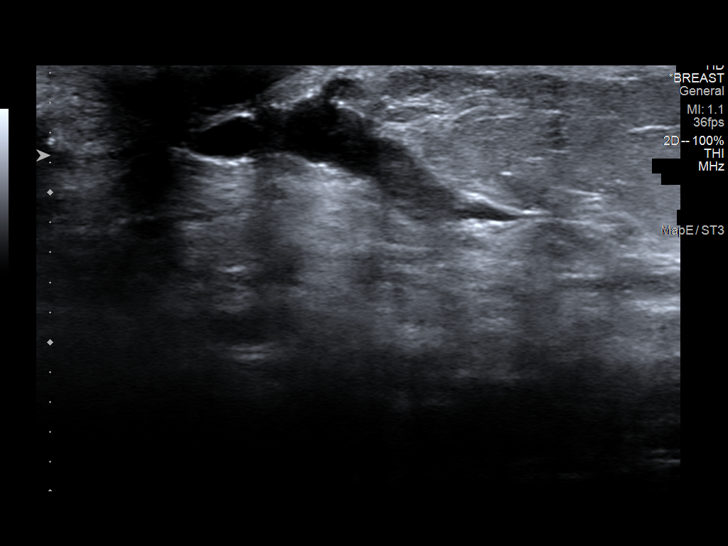
[im 8/12]
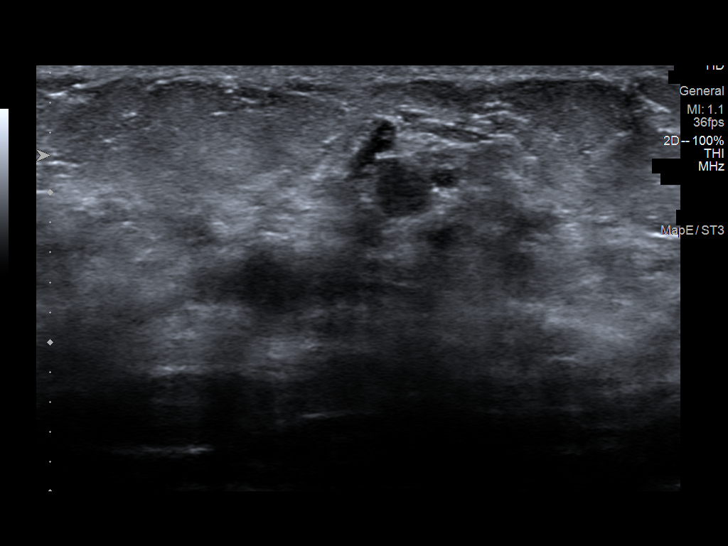
[im 9/12]
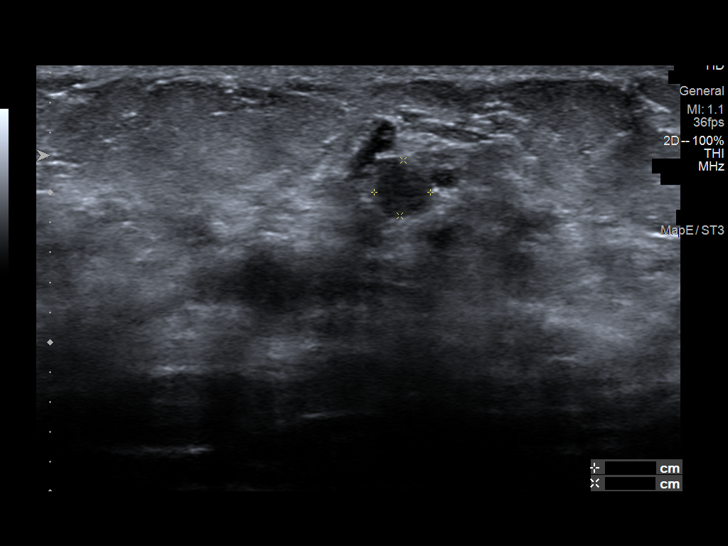
[im 10/12]
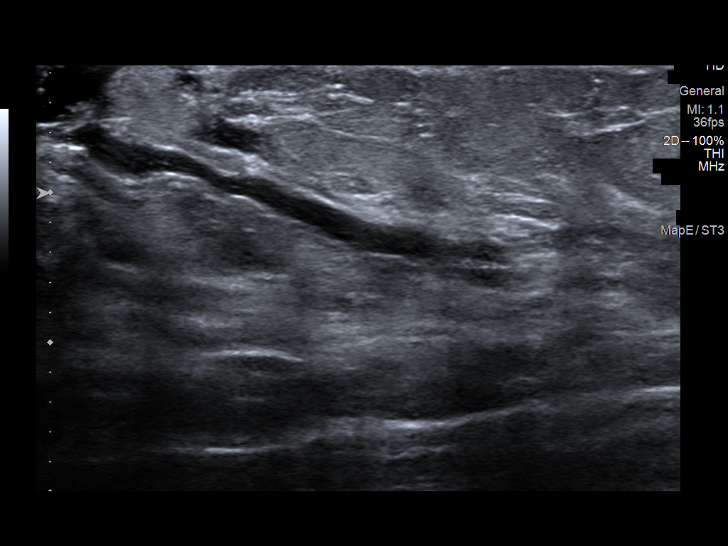
[im 11/12]
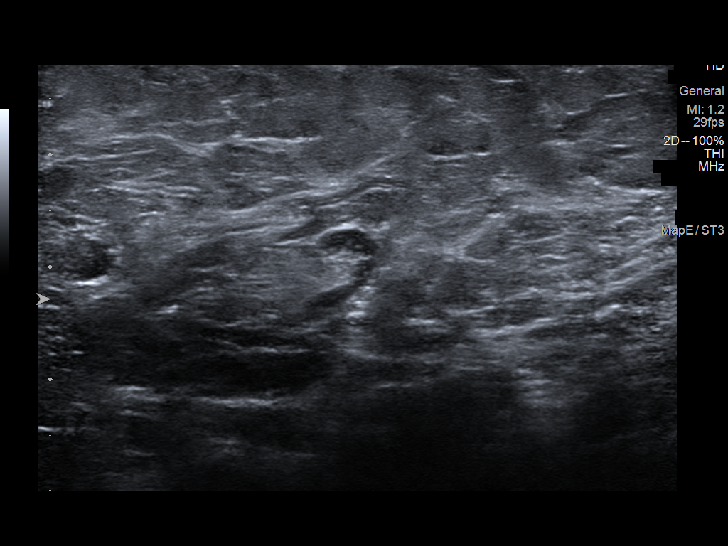
[im 12/12]
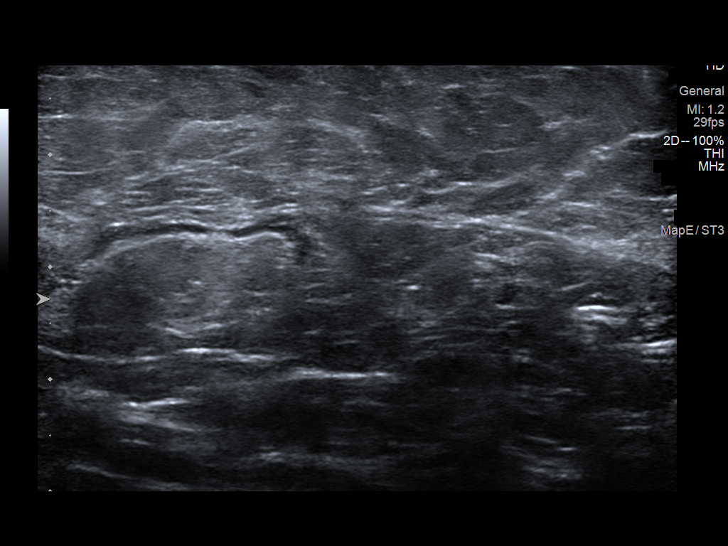

[12 of 12 positions shown; findings below may reference images not displayed]

ACR Breast Density Category c: The breast tissue is heterogeneously
dense, which may obscure small masses.
FINDINGS: No suspicious mass, distortion, or microcalcifications are
identified to suggest presence of malignancy.

Targeted ultrasound is performed, showing a mildly dilated duct in
the immediate retroareolar region of the RIGHT breast which contains
a filling defect. Intraductal mass measures 0.7 x 0.4 x
centimeters. There is minimal internal blood flow associated with
the mass.

Evaluation of the RIGHT axilla is negative for adenopathy.
IMPRESSION: Intraductal mass in the retroareolar region of the RIGHT breast
possibly accounting for the patient's spontaneous nipple discharge.

RECOMMENDATION:
Recommend ultrasound-guided core biopsy of the RIGHT breast.

I have discussed the findings and recommendations with the patient.
If applicable, a reminder letter will be sent to the patient
regarding the next appointment.

BI-RADS CATEGORY  4: Suspicious.

## 2022-07-07 ENCOUNTER — Emergency Department (HOSPITAL_COMMUNITY)
Admission: EM | Admit: 2022-07-07 | Discharge: 2022-07-07 | Discharge status: Against Medical Advice | Payer: Medicaid Other | Attending: Emergency Medicine | Admitting: Emergency Medicine

## 2022-07-07 DIAGNOSIS — J00 Acute nasopharyngitis [common cold]: Secondary | ICD-10-CM | POA: Diagnosis not present

## 2022-07-07 DIAGNOSIS — Z5321 Procedure and treatment not carried out due to patient leaving prior to being seen by health care provider: Secondary | ICD-10-CM | POA: Insufficient documentation

## 2022-07-07 DIAGNOSIS — R0602 Shortness of breath: Secondary | ICD-10-CM | POA: Insufficient documentation

## 2022-07-17 ENCOUNTER — Telehealth: Payer: Self-pay

## 2022-07-17 NOTE — Telephone Encounter (Signed)
Patient attempted to be outreached by Junius Finner, PharmD Candidate on 07/17/2022 to discuss hypertension. Left voicemail for patient to return our call at their convenience at [phone].   [place] [facility]  PharmD Candidate 2024   Maryan Puls, PharmD PGY-1 [facility] Pharm[facility] Resident

## 2022-07-22 IMAGING — MG MM PLC BREAST LOC DEV 1ST LESION INC*R*
6 series · 6 of 6 positions shown · non-contrast
Comparison: Previous exam(s).

CLINICAL DATA: Biopsy proven intraductal papilloma in the right
breast. Radioactive seed localization prior to surgery.

EXAM:
MAMMOGRAPHIC GUIDED RADIOACTIVE SEED LOCALIZATION OF THE RIGHT
BREAST

[R CC (1 of 3)]
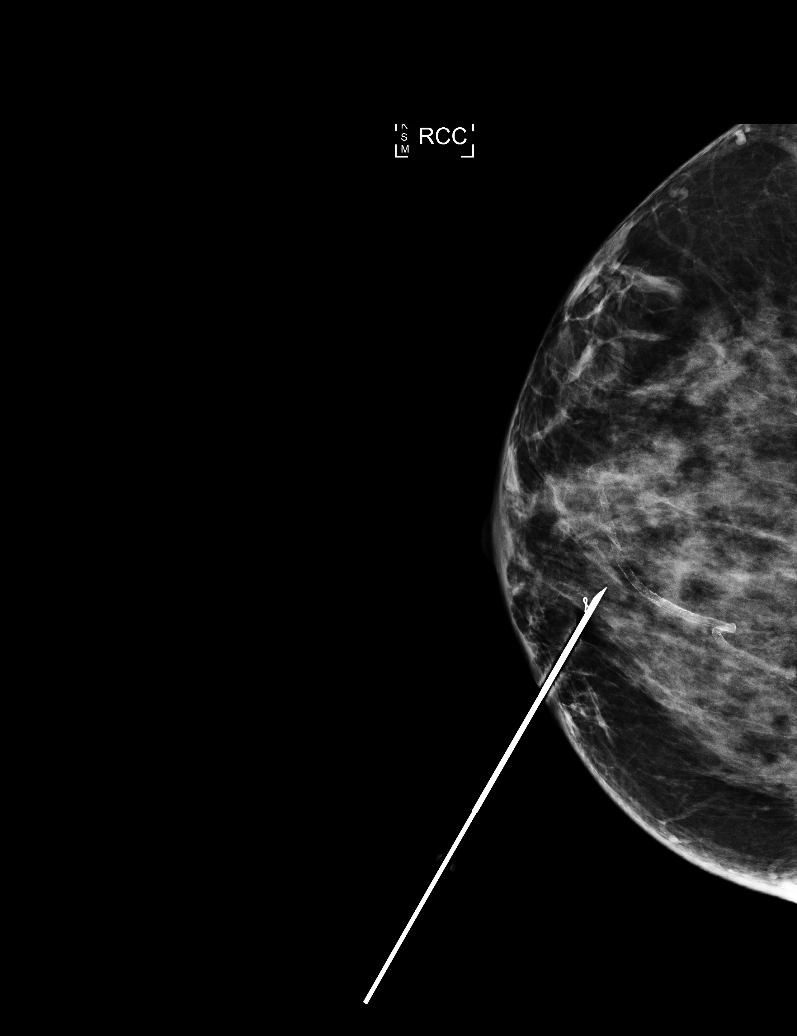

[R ML (1 of 3)]
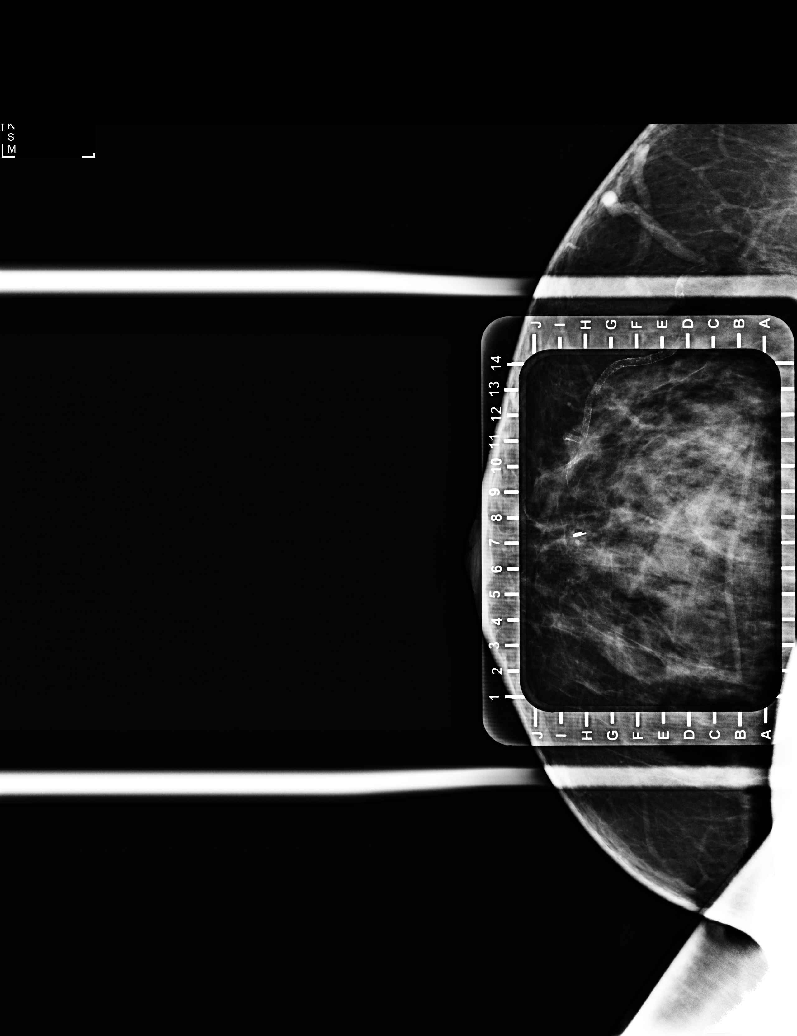

[R ML (2 of 3)]
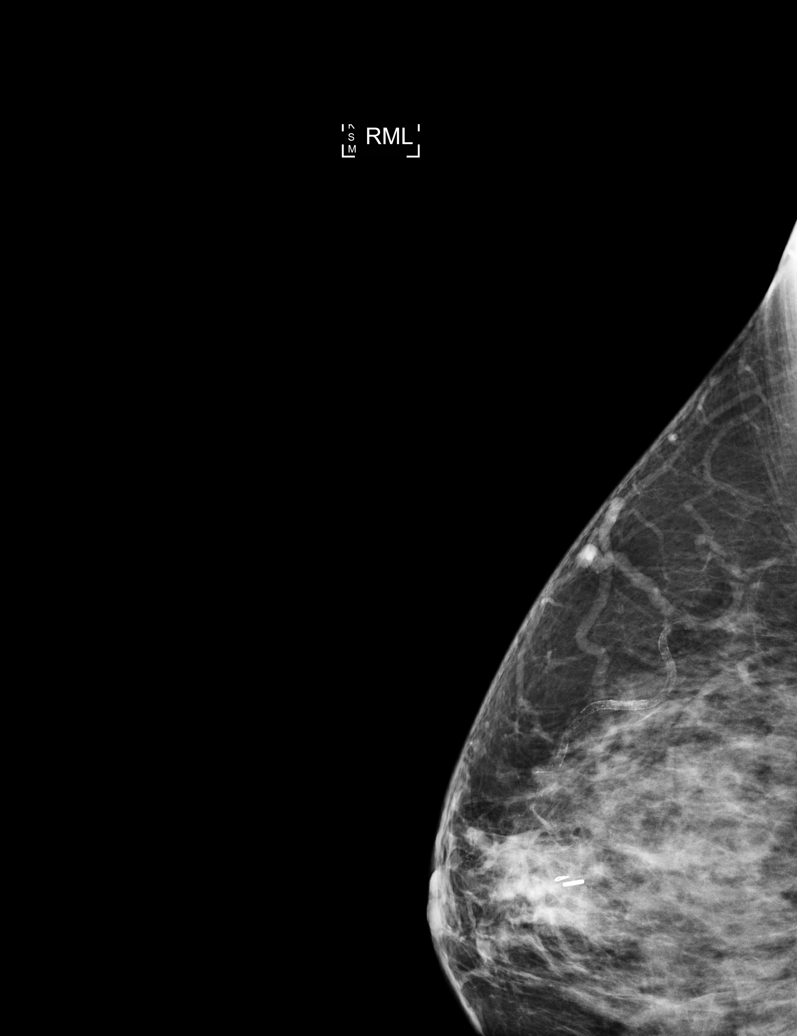

[R CC (2 of 3)]
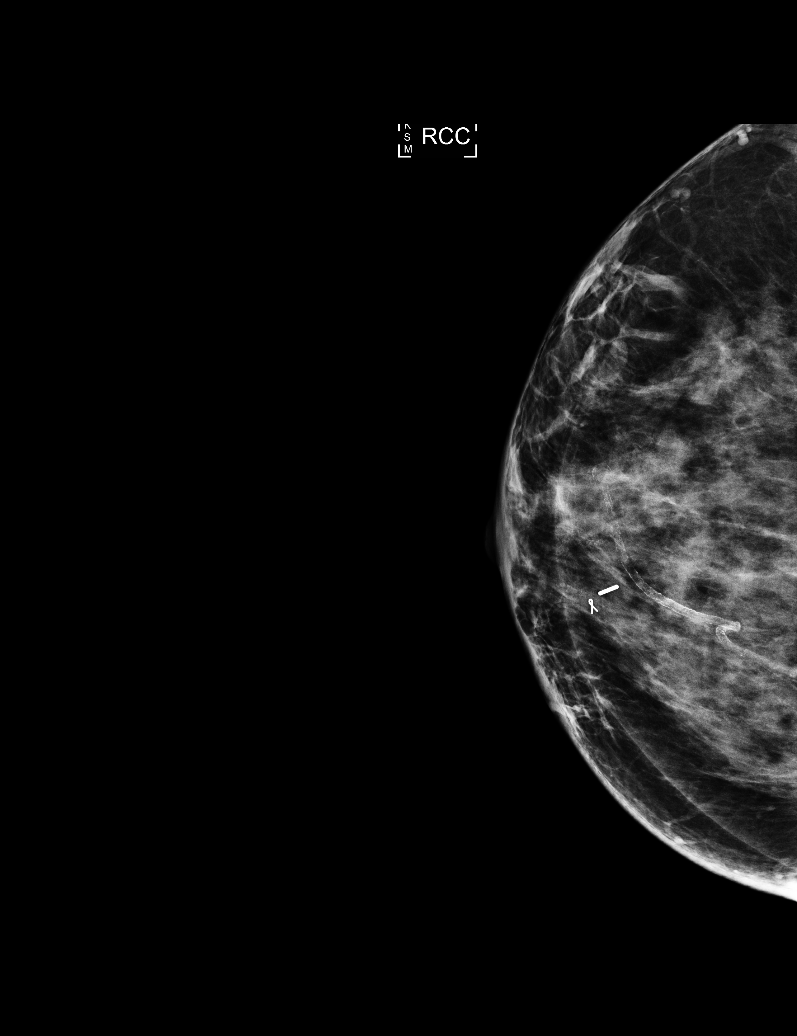

[R ML (3 of 3)]
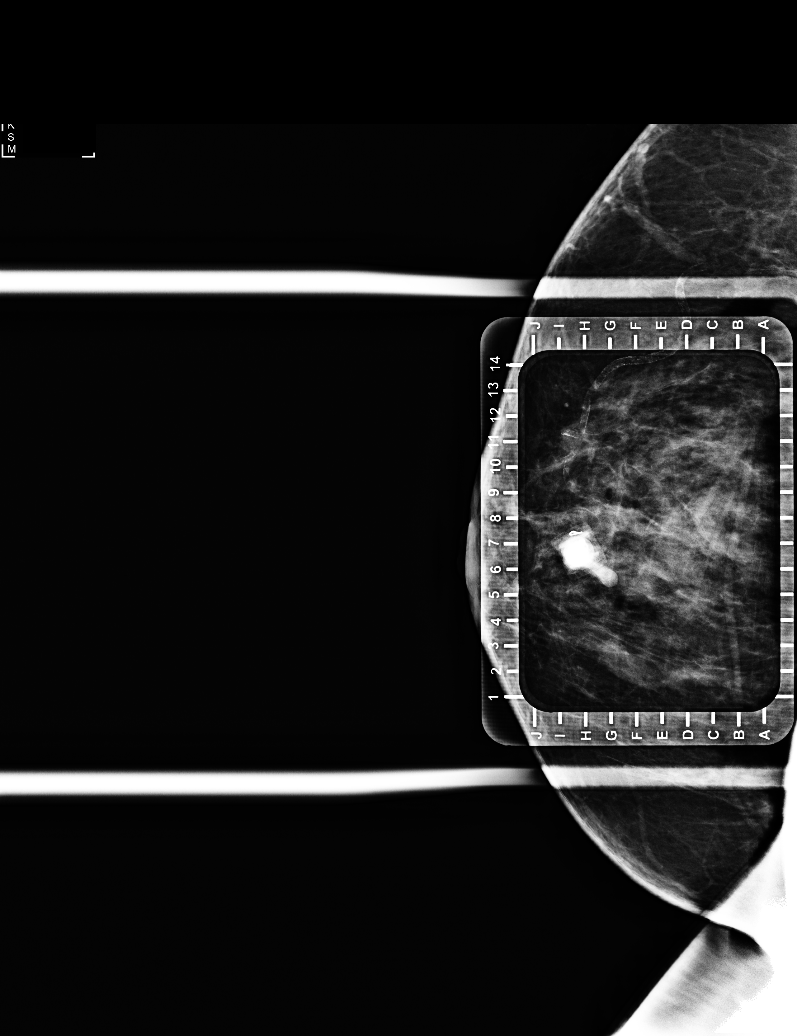

[R CC (3 of 3)]
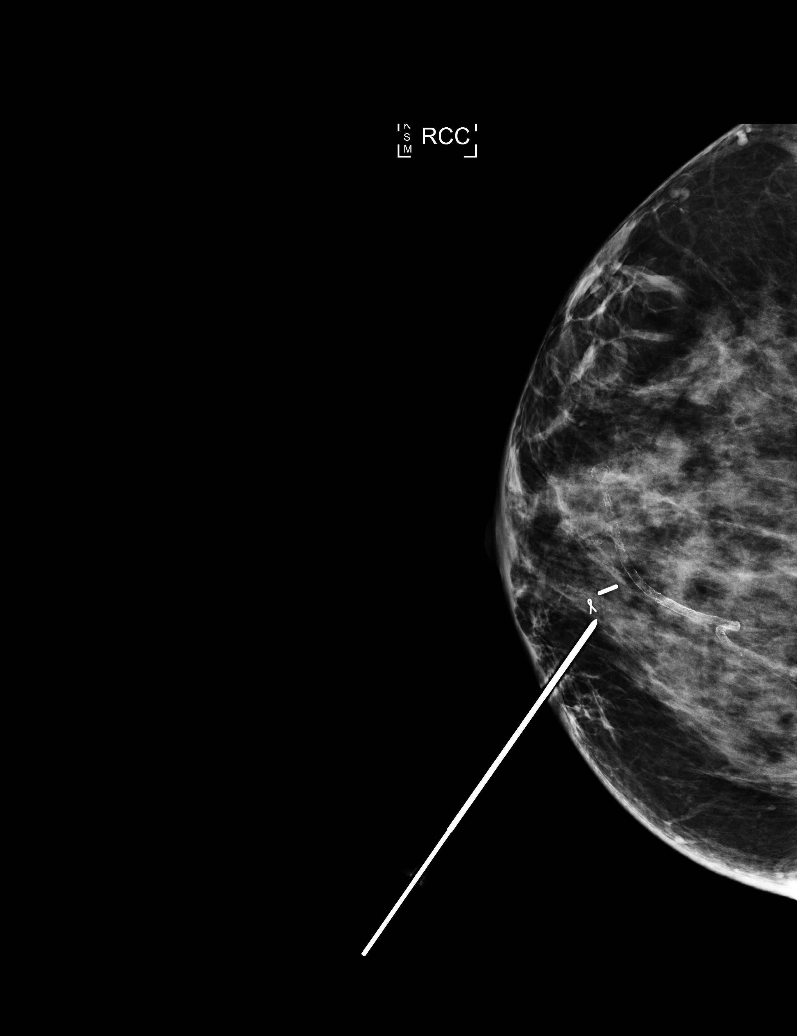

[6 of 6 positions shown; findings below may reference images not displayed]



The usual time-out protocol was performed immediately prior to the
procedure.

Using mammographic guidance, sterile technique, 1% lidocaine and an
F-GC6 radioactive seed, the ribbon shaped clip was localized using a
medial to lateral approach. The follow-up mammogram images confirm
the seed in the expected location and were marked for Dr. Aujla.

Follow-up survey of the patient confirms presence of the radioactive
seed.

Order number of F-GC6 seed:  [id number].

Total activity:  0.245 millicuries reference Date: 01/21/2021

The patient tolerated the procedure well and was released from the
[REDACTED]. She was given instructions regarding seed removal.
IMPRESSION: Radioactive seed localization right breast. No apparent
complications.

## 2022-07-23 IMAGING — MG MM BREAST SURGICAL SPECIMEN
2 series · 4 of 4 positions shown · non-contrast
Comparison: Previous exam(s).

CLINICAL DATA: Evaluate surgical specimen following RIGHT breast
excision.

EXAM:
SPECIMEN RADIOGRAPH OF THE RIGHT BREAST

[Series 2: R · right · 0.07mm/px · 2 of 2 slices shown (1 of 2)]
[im 1/2]
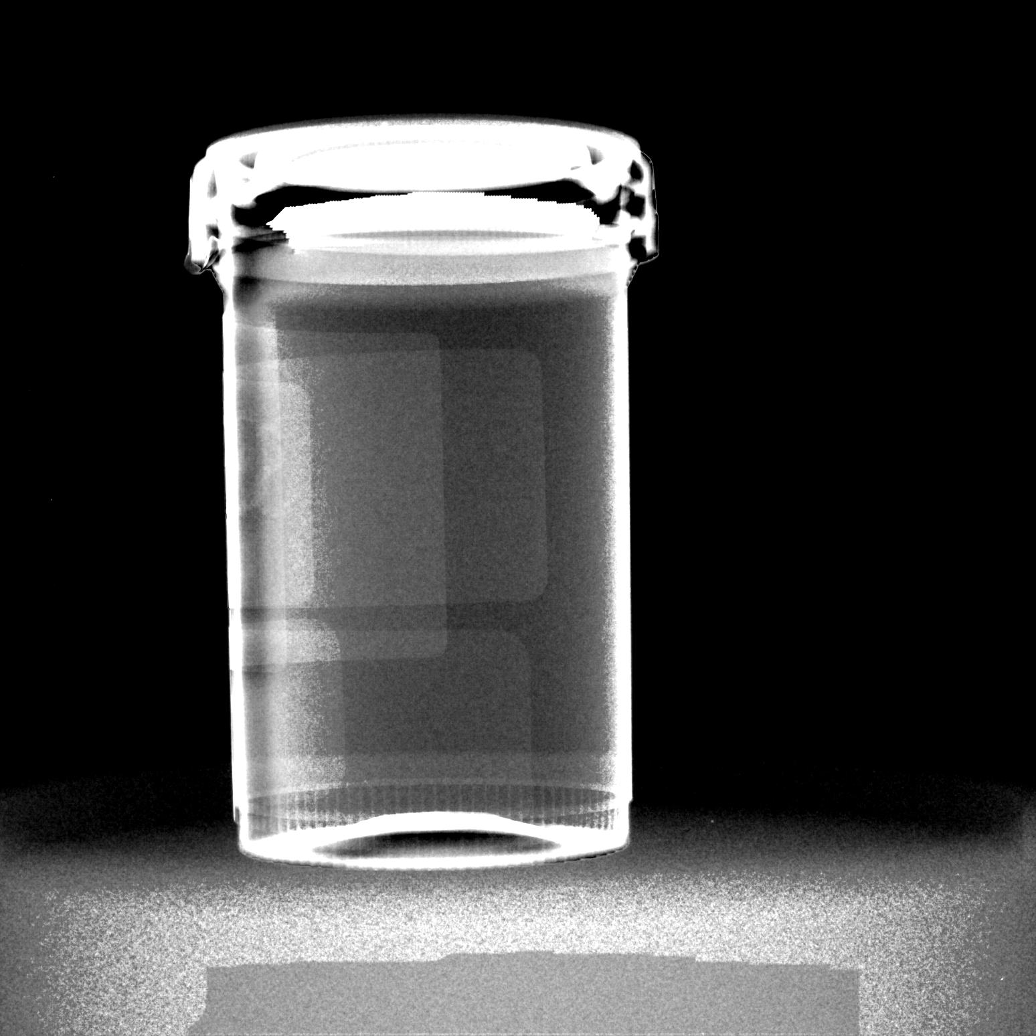
[im 2/2]
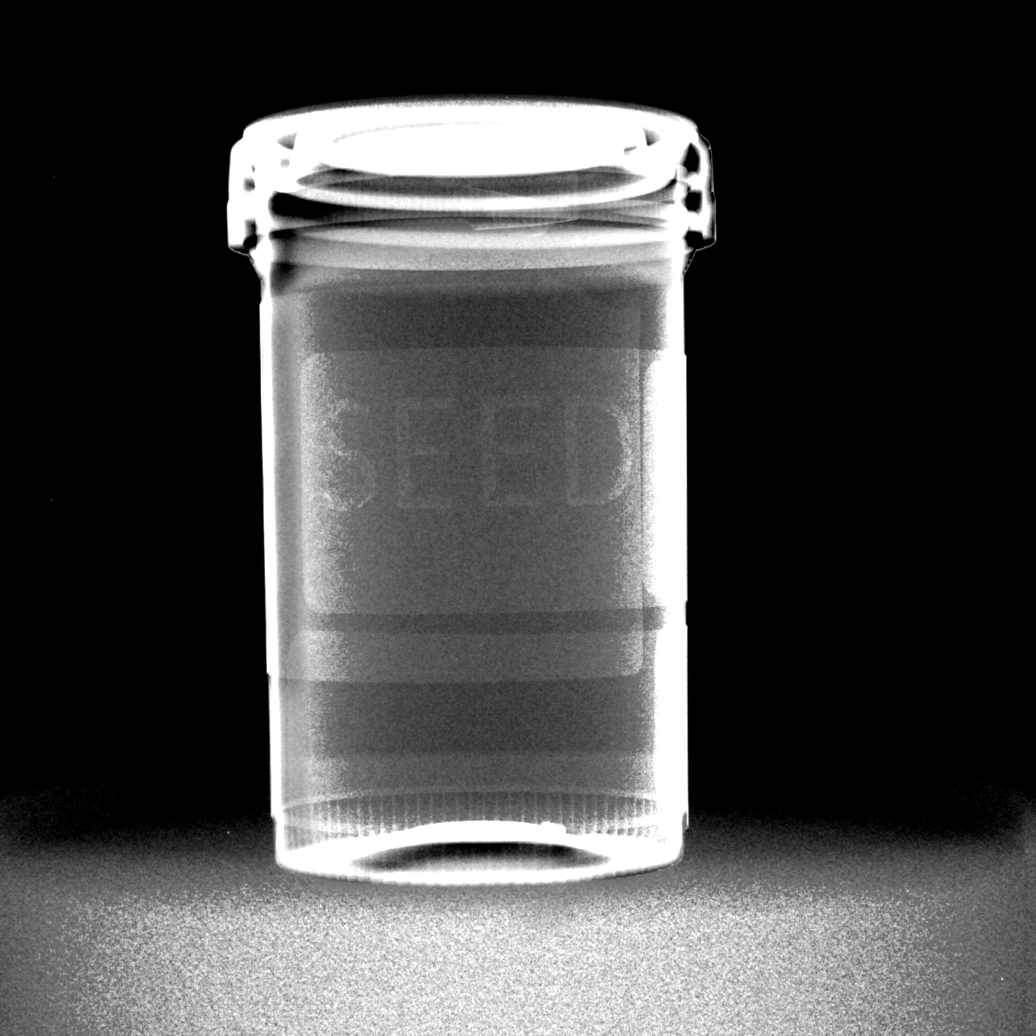

[Series 4: R · right · 0.07mm/px · 2 of 2 slices shown (2 of 2)]
[im 1/2]
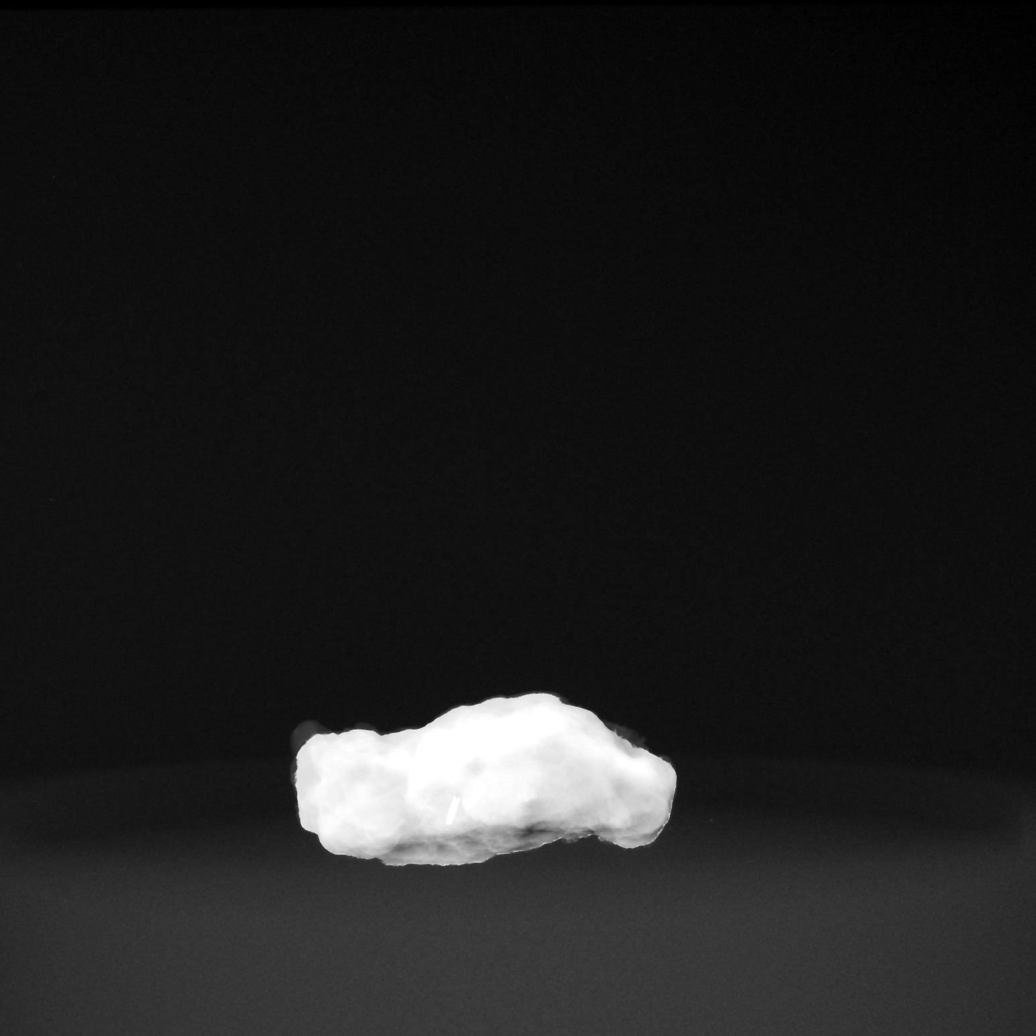
[im 2/2]
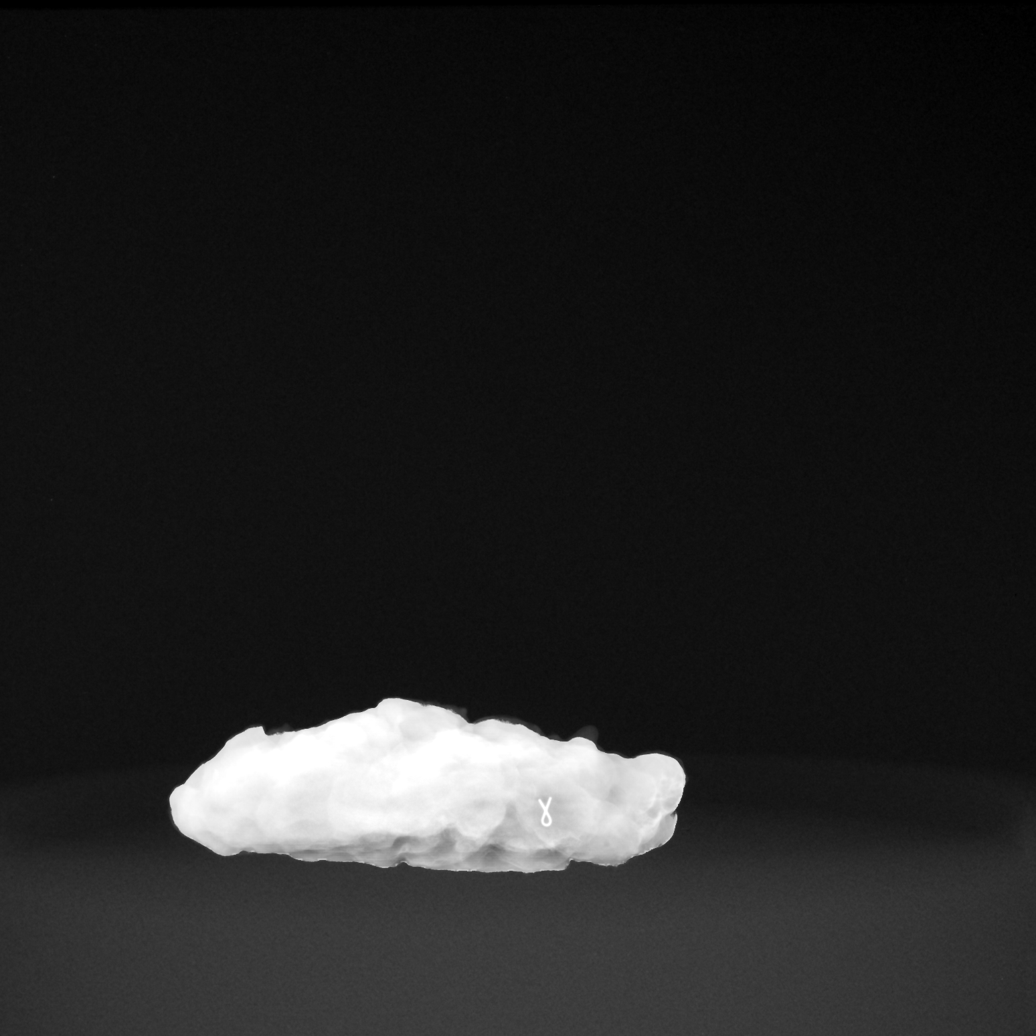

[4 of 4 positions shown; findings below may reference images not displayed]

FINDINGS: Status post excision of the RIGHT breast. The RIBBON biopsy marker
clip is present within the specimen. The radioactive seed is present
within a separate container.
IMPRESSION: Specimen radiograph of the RIGHT breast.

## 2022-07-24 NOTE — Telephone Encounter (Signed)
Patient attempted to be outreached by Junius Finner, PharmD Candidate on 07/24/2022 to discuss hypertension. Left voicemail for patient to return our call at their convenience at [phone].   [place] [facility]  PharmD Candidate 2024   Maryan Puls, PharmD PGY-1 [facility] Pharmacy Resident

## 2022-07-28 NOTE — Telephone Encounter (Signed)
Patient attempted to be outreached by Levi Aland, PharmD Candidate on 07/28/22 to discuss hypertension. Left voicemail for patient to return our call at their convenience at [phone].   Levi Aland, PharmD Candidate, Class of 2024  Luther E. Marsh, PharmD PGY-1 [facility] Pharmacy Resident

## 2023-08-24 ENCOUNTER — Emergency Department (HOSPITAL_COMMUNITY)
Admission: EM | Admit: 2023-08-24 | Discharge: 2023-08-24 | Disposition: A | Discharge status: Home / Self Care | Attending: Emergency Medicine | Admitting: Emergency Medicine

## 2023-08-24 ENCOUNTER — Other Ambulatory Visit: Payer: Self-pay

## 2023-08-24 ENCOUNTER — Emergency Department (HOSPITAL_COMMUNITY)

## 2023-08-24 DIAGNOSIS — N189 Chronic kidney disease, unspecified: Secondary | ICD-10-CM | POA: Insufficient documentation

## 2023-08-24 DIAGNOSIS — M79674 Pain in right toe(s): Secondary | ICD-10-CM | POA: Diagnosis present

## 2023-08-24 DIAGNOSIS — Z79899 Other long term (current) drug therapy: Secondary | ICD-10-CM | POA: Diagnosis not present

## 2023-08-24 DIAGNOSIS — Z7951 Long term (current) use of inhaled steroids: Secondary | ICD-10-CM | POA: Diagnosis not present

## 2023-08-24 DIAGNOSIS — I129 Hypertensive chronic kidney disease with stage 1 through stage 4 chronic kidney disease, or unspecified chronic kidney disease: Secondary | ICD-10-CM | POA: Diagnosis not present

## 2023-08-24 DIAGNOSIS — J45909 Unspecified asthma, uncomplicated: Secondary | ICD-10-CM | POA: Diagnosis not present

## 2023-08-24 LAB — CBC WITH DIFFERENTIAL/PLATELET
Abs Immature Granulocytes: 0.02 10*3/uL (ref 0.00–0.07)
Basophils Absolute: 0.1 10*3/uL (ref 0.0–0.1)
Basophils Relative: 1 %
Eosinophils Absolute: 0.2 10*3/uL (ref 0.0–0.5)
Eosinophils Relative: 2 %
HCT: 33 % — ABNORMAL LOW (ref 36.0–46.0)
Hemoglobin: 10.2 g/dL — ABNORMAL LOW (ref 12.0–15.0)
Immature Granulocytes: 0 %
Lymphocytes Relative: 35 %
Lymphs Abs: 2.6 10*3/uL (ref 0.7–4.0)
MCH: 26.2 pg (ref 26.0–34.0)
MCHC: 30.9 g/dL (ref 30.0–36.0)
MCV: 84.6 fL (ref 80.0–100.0)
Monocytes Absolute: 0.4 10*3/uL (ref 0.1–1.0)
Monocytes Relative: 6 %
Neutro Abs: 4.1 10*3/uL (ref 1.7–7.7)
Neutrophils Relative %: 56 %
Platelets: 266 10*3/uL (ref 150–400)
RBC: 3.9 MIL/uL (ref 3.87–5.11)
RDW: 15.9 % — ABNORMAL HIGH (ref 11.5–15.5)
WBC: 7.3 10*3/uL (ref 4.0–10.5)
nRBC: 0 % (ref 0.0–0.2)

## 2023-08-24 LAB — BASIC METABOLIC PANEL
Anion gap: 6 (ref 5–15)
BUN: 36 mg/dL — ABNORMAL HIGH (ref 6–20)
CO2: 21 mmol/L — ABNORMAL LOW (ref 22–32)
Calcium: 9.6 mg/dL (ref 8.9–10.3)
Chloride: 111 mmol/L (ref 98–111)
Creatinine, Ser: 1.99 mg/dL — ABNORMAL HIGH (ref 0.44–1.00)
GFR, Estimated: 29 mL/min — ABNORMAL LOW (ref >60)
Glucose, Bld: 102 mg/dL — ABNORMAL HIGH (ref 70–99)
Potassium: 4.1 mmol/L (ref 3.5–5.1)
Sodium: 138 mmol/L (ref 135–145)

## 2023-08-24 MED ORDER — PREDNISONE 50 MG PO TABS
50.0000 mg | ORAL_TABLET | Freq: Every day | ORAL | 0 refills | Status: AC
  Filled 2023-08-24: qty 5, 5d supply, fill #0

## 2023-08-24 MED ORDER — ACETAMINOPHEN 325 MG PO TABS
650.0000 mg | ORAL_TABLET | Freq: Once | ORAL | Status: AC
  Administered 2023-08-24: 650 mg via ORAL
  Filled 2023-08-24: qty 2

## 2023-08-24 NOTE — ED Provider Notes (Signed)
 [facility] AT [facility] Provider Note   CSN: [id number] Arrival date & time: 08/24/23  0915     History Chief Complaint  Patient presents with   Foot Pain    Amy Barajas is a 53 y.o. female.  HPI Patient is a 53 year old female presents the ED today with a 1 day history of right MTP joint pain that began suddenly with no known trauma.  Denies any fever, chest pain, shortness of breath, abdominal pain, nausea, vomiting, diarrhea, dysuria, lower leg swelling.    Home Medications Prior to Admission medications   Medication Sig Start Date End Date Taking? Authorizing Provider  predniSONE (DELTASONE) 50 MG tablet Take 1 tablet by mouth daily for the next 5 days 08/24/23  Yes Nechama Guard, [place], PA-C  albuterol (VENTOLIN HFA) 108 (90 Base) MCG/ACT inhaler Inhale 2 puffs into the lungs every 6 (six) hours as needed for wheezing or shortness of breath. Patient not taking: Reported on 11/19/2021 08/26/21   Marcine Matar, MD  budesonide-formoterol [facility]) 80-4.5 MCG/ACT inhaler Inhale 2 puffs into the lungs 2 (two) times daily. 08/26/21   Marcine Matar, MD  famotidine (PEPCID) 20 MG tablet Take 1 tablet (20 mg total) by mouth 2 (two) times daily. 08/26/21   Marcine Matar, MD  hydrochlorothiazide (HYDRODIURIL) 12.5 MG tablet Take 1 tablet (12.5 mg total) by mouth daily. 08/26/21   Marcine Matar, MD  valsartan (DIOVAN) 40 MG tablet Take 1 tablet (40 mg total) by mouth daily. 08/26/21   Marcine Matar, MD      Allergies    Norvasc [amlodipine besylate]    Review of Systems   Review of Systems  Physical Exam Updated Vital Signs BP (!) 156/110 (BP Location: Left Arm)   Pulse (!) 104   Temp 98 F (36.7 C)   Resp 16   LMP 10/04/2015 (Approximate)   SpO2 98%  Physical Exam Vitals and nursing note reviewed.  Constitutional:      General: She is not in acute distress.    Appearance: Normal appearance. She is not ill-appearing.   HENT:     Head: Normocephalic and atraumatic.  Eyes:     General: No scleral icterus.       Right eye: No discharge.        Left eye: No discharge.     Extraocular Movements: Extraocular movements intact.     Conjunctiva/sclera: Conjunctivae normal.  Cardiovascular:     Rate and Rhythm: Normal rate and regular rhythm.     Pulses: Normal pulses.     Heart sounds: Normal heart sounds. No murmur heard.    No friction rub. No gallop.  Pulmonary:     Effort: Pulmonary effort is normal. No respiratory distress.     Breath sounds: Normal breath sounds.  Abdominal:     General: Abdomen is flat. There is no distension.     Palpations: Abdomen is soft.     Tenderness: There is no abdominal tenderness. There is no right CVA tenderness, left CVA tenderness or guarding.  Musculoskeletal:        General: Swelling and tenderness (Mildly swollen, tender to palpation MTP joint of left foot on big toe, erythema also noted.) present.     Right lower leg: No edema.  Skin:    General: Skin is warm and dry.     Coloration: Skin is not pale.     Findings: Erythema present. No bruising or lesion.  Comments: Unable to evaluate nailbed due to painted toenails.  Neurological:     General: No focal deficit present.     Mental Status: She is alert and oriented to person, place, and time. Mental status is at baseline.     Sensory: No sensory deficit.     Motor: No weakness.  Psychiatric:        Mood and Affect: Mood normal.     ED Results / Procedures / Treatments   Labs (all labs ordered are listed, but only abnormal results are displayed) Labs Reviewed  CBC WITH DIFFERENTIAL/PLATELET - Abnormal; Notable for the following components:      Result Value   Hemoglobin 10.2 (*)    HCT 33.0 (*)    RDW 15.9 (*)    All other components within normal limits  BASIC METABOLIC PANEL - Abnormal; Notable for the following components:   CO2 21 (*)    Glucose, Bld 102 (*)    BUN 36 (*)    Creatinine, Ser  1.99 (*)    GFR, Estimated 29 (*)    All other components within normal limits    EKG EKG Interpretation Date/Time:  Monday August 24 2023 10:05:44 EDT Ventricular Rate:  96 PR Interval:  154 QRS Duration:  82 QT Interval:  337 QTC Calculation: 426 R Axis:   17  Text Interpretation: Sinus rhythm Nonspecific T abnormalities, inferior leads Borderline ST elevation, lateral leads No significant change since last tracing Confirmed by Gwyneth Sprout ([id number]) on 08/24/2023 11:25:17 AM  Radiology DG Foot Complete Left Result Date: 08/24/2023 CLINICAL DATA:  Left foot pain. EXAM: LEFT FOOT - COMPLETE 3+ VIEW COMPARISON:  None Available. FINDINGS: There is no evidence of fracture or dislocation. There is no evidence of arthropathy or other focal bone abnormality. Soft tissues are unremarkable. IMPRESSION: Negative. Electronically Signed   By: Elgie Collard M.D.   On: 08/24/2023 11:13    Procedures Procedures    Medications Ordered in ED Medications  acetaminophen (TYLENOL) tablet 650 mg (650 mg Oral Given 08/24/23 6045)    ED Course/ Medical Decision Making/ A&P                                 Medical Decision Making  This patient is a 53 year old female who presents to the ED for concern of right MTP pain of big toe that began acutely last night with no known trauma.  Denies any fevers, leg swelling, foot swelling.  On physical exam, patient is noted to be afebrile, no acute distress, speaking full sentences.  Noted to be tachycardic.  Patient noted to have mild edema and erythema over the MTP joint of the right big toe as well as skin being warm to the touch.  Physical exam is unremarkable otherwise.  Labs did notes a anemia that was baseline with patient's previous labs as well as noting a decrease in kidney function.  However despite labs, patient is currently asymptomatic and will have patient follow-up with PCP for redraw and evaluation for kidney function as well as follow-up  for possible gouty attack.  Low suspicion for any other emergent pathology present this time.  Patient was initially tachycardic on arrival but has become down nontachycardic with BPM of 90s.  I believe patient is a to be discharged at this time and followed up in outpatient setting.  All questions were answered.  Patient expressed agreement understanding of plan.  Discussed  case with attending who also agreed with plan.  Differential diagnoses prior to evaluation: The emergent differential diagnosis includes, but is not limited to, septic arthritis, gout, fracture, ligamentous injury, arterial insufficiency, venous insufficiency. This is not an exhaustive differential.   Past Medical History / Co-morbidities / Social History: HTN, asthma, chronic UTI, acid reflux, CKD  Status post cholecystectomy, breast lumpectomy  Lab Tests/Imaging studies: I personally interpreted labs/imaging and the pertinent results include:   CBC noted to have a heme of 10.2 which is at baseline with previous CBCs BMP was noted to have an elevated creatinine of 1.99 and lower GFR of 29 which seems to be increased from previous 2 years ago likely due to CKD as patient is currently symptomatic at this time. Foot x-ray unremarkable. I agree with the radiologist interpretation.  Cardiac monitoring: EKG obtained and interpreted by myself and attending physician which shows: Sinus rhythm with ST abnormalities that are unchanged from previous EKGs   Medications: I ordered medication including Tylenol.  I have reviewed the patients home medicines and have made adjustments as needed.    Disposition: After consideration of the diagnostic results and the patients response to treatment, I feel that patient benefit from discharge and treatment as above.   emergency department workup does not suggest an emergent condition requiring admission or immediate intervention beyond what has been performed at this time. The plan is:  Patient will need to follow-up with PCP for abnormal labs and will provide prednisone for possible gouty attack, return for any new or worsening symptoms. The patient is safe for discharge and has been instructed to return immediately for worsening symptoms, change in symptoms or any other concerns.  Final Clinical Impression(s) / ED Diagnoses Final diagnoses:  Great toe pain, right    Rx / DC Orders ED Discharge Orders          Ordered    predniSONE (DELTASONE) 50 MG tablet        08/24/23 1136              Baldo Ash [place], [place] 08/24/23 1143    Gwyneth Sprout, MD 08/25/23 1436

## 2023-08-24 NOTE — ED Triage Notes (Signed)
 Pt c/o L mid foot pain since yesterday. Denies known injury/trauma. No other complaints per pt.

## 2023-08-24 NOTE — Discharge Instructions (Addendum)
 You were seen today for right toe pain.  I believe is most likely due to a gouty arthritis which you should take prednisone for the next 5 days once per day.  That should help decrease inflammation.  You can also take Tylenol for further pain relief.  Take Tylenol (acetominophen)  650mg  every 4-6 hours, as needed for pain or fever. Do not take more than 4,000 mg in a 24-hour period. As this may cause liver damage. While this is rare, if you begin to develop yellowing of the skin or eyes, stop taking and return to ER immediately.  Due to a decrease in kidney function, recommend you follow-up with your primary care physician to have further evaluation for your chronic kidney disease.   Return to the ED for any new or worsening symptoms including chest pain, shortness of breath, fever, worsening pain despite medication

## 2023-08-25 ENCOUNTER — Other Ambulatory Visit: Payer: Self-pay

## 2023-09-02 ENCOUNTER — Ambulatory Visit: Attending: Nurse Practitioner | Admitting: Nurse Practitioner

## 2023-09-02 ENCOUNTER — Other Ambulatory Visit: Payer: Self-pay

## 2023-09-02 ENCOUNTER — Encounter: Payer: Self-pay | Admitting: Nurse Practitioner

## 2023-09-02 VITALS — BP 151/111 | HR 83 | Resp 20 | Ht 69.5 in | Wt 237.0 lb

## 2023-09-02 DIAGNOSIS — E7800 Pure hypercholesterolemia, unspecified: Secondary | ICD-10-CM | POA: Diagnosis not present

## 2023-09-02 DIAGNOSIS — J4530 Mild persistent asthma, uncomplicated: Secondary | ICD-10-CM

## 2023-09-02 DIAGNOSIS — I10 Essential (primary) hypertension: Secondary | ICD-10-CM

## 2023-09-02 DIAGNOSIS — Z1211 Encounter for screening for malignant neoplasm of colon: Secondary | ICD-10-CM

## 2023-09-02 DIAGNOSIS — Z1231 Encounter for screening mammogram for malignant neoplasm of breast: Secondary | ICD-10-CM

## 2023-09-02 DIAGNOSIS — K219 Gastro-esophageal reflux disease without esophagitis: Secondary | ICD-10-CM

## 2023-09-02 DIAGNOSIS — M79672 Pain in left foot: Secondary | ICD-10-CM

## 2023-09-02 MED ORDER — ALBUTEROL SULFATE HFA 108 (90 BASE) MCG/ACT IN AERS: 2.0000 | INHALATION_SPRAY | Freq: Four times a day (QID) | RESPIRATORY_TRACT | 3 refills | Status: AC | PRN

## 2023-09-02 MED ORDER — BUDESONIDE-FORMOTEROL FUMARATE 80-4.5 MCG/ACT IN AERO
2.0000 | INHALATION_SPRAY | Freq: Two times a day (BID) | RESPIRATORY_TRACT | 3 refills | Status: AC
Start: 2023-09-02 — End: ?

## 2023-09-02 MED ORDER — FAMOTIDINE 20 MG PO TABS
20.0000 mg | ORAL_TABLET | Freq: Two times a day (BID) | ORAL | 1 refills | Status: AC
Start: 2023-09-02 — End: ?

## 2023-09-02 MED ORDER — HYDROCHLOROTHIAZIDE 12.5 MG PO TABS: 12.5000 mg | ORAL_TABLET | Freq: Every day | ORAL | 1 refills | Status: DC

## 2023-09-02 MED ORDER — VALSARTAN 40 MG PO TABS
40.0000 mg | ORAL_TABLET | Freq: Every day | ORAL | 1 refills | Status: DC
Start: 2023-09-02 — End: 2023-10-01

## 2023-09-02 MED ORDER — PREDNISONE 10 MG PO TABS: ORAL_TABLET | ORAL | 0 refills | Status: DC

## 2023-09-02 NOTE — Patient Instructions (Signed)
 [facility] Located in: [facility] Address: [address], [place], [place] [address] Phone: [phone]    [facility] Located in: [facility] [address] Address: [address], [place], [place] [address] Phone: [phone]

## 2023-09-02 NOTE — Progress Notes (Signed)
 I have seen and examined this patient with the advanced practice provider STUDENT and agree with the note below

## 2023-09-02 NOTE — Progress Notes (Signed)
 Assessment & Plan:  Amy Barajas was seen today for ed visit.  Diagnoses and all orders for this visit:  Primary hypertension -     Thyroid Panel With TSH -     hydrochlorothiazide (HYDRODIURIL) 12.5 MG tablet; Take 1 tablet (12.5 mg total) by mouth daily. -     valsartan (DIOVAN) 40 MG tablet; Take 1 tablet (40 mg total) by mouth daily. Blood pressure 162/102 mmHg, rechecked 151/111 mmHg Check blood pressure at home Recommended DASH/Mediterranean diet.  Encounter for screening mammogram for malignant neoplasm of breast -     MM 3D SCREENING MAMMOGRAM BILATERAL BREAST; Future  Left foot pain -     Uric Acid -     predniSONE (DELTASONE) 10 MG tablet; Take 4 tablets (40 mg total) by mouth daily with breakfast for 3 days, THEN 3 tablets (30 mg total) daily with breakfast for 3 days, THEN 2 tablets (20 mg total) daily with breakfast for 3 days, THEN 1 tablet (10 mg total) daily with breakfast for 3 days.  Low purine diet education material provided.  Protect foot from injury, keep foot elevated. Minimize activity that worsens pain  Hypercholesteremia -     Lipid Panel  Colon cancer screening -     Ambulatory referral to Gastroenterology  Mild persistent asthma without complication -     albuterol (VENTOLIN HFA) 108 (90 Base) MCG/ACT inhaler; Inhale 2 puffs into the lungs every 6 (six) hours as needed for wheezing or shortness of breath. -     budesonide-formoterol (SYMBICORT) 80-4.5 MCG/ACT inhaler; Inhale 2 puffs into the lungs 2 (two) times daily.  GERD without esophagitis -     famotidine (PEPCID) 20 MG tablet; Take 1 tablet (20 mg total) by mouth 2 (two) times daily. INSTRUCTIONS: Avoid GERD Triggers: acidic, spicy or fried foods, caffeine, coffee, sodas,  alcohol and chocolate.      Patient has been counseled on age-appropriate routine health concerns for screening and prevention. These are reviewed and up-to-date. Referrals have been placed accordingly. Immunizations are  up-to-date or declined.    Subjective:   Chief Complaint  Patient presents with   ED Visit    Follow up on left foot pain.    Amy Barajas 53 y.o. female presents to office today for ED follow up on left great toe pain. Patient reports pain 8/10 on pain scale. Left great toe is swollen and tender to touch. Denies injury to toe. States she had [place] food over the weekend, 08/22/2023 and woke up next morning,  with increased pain. Went to ED on 08/24/2023 and prescribed Prednisone 50 mg every day for 5 days. Had minimal relief with Prednisone and Tylenol prn. Pain is causing difficulty with walking and standing at her job at [employer]. She missed Monday and Tuesday from work  (08-31-2023 and 09-01-2023) and will need FMLA paperwork completed with [employer] for missed days with return to work on 09-07-2023.   Blood pressure elevated at 162/102 mmHg and recheck 151/111 mmHg. She has taken blood pressure sporadically. Her last clinic visit was 08/26/2021. Blood pressure recheck in 4 weeks. Instructed patient to monitor blood pressure at home.   She was given the phone numbers for GI and Breast center for colonoscopy and mammogram to schedule.     Review of Systems  Constitutional: Negative.   HENT: Negative.    Eyes: Negative.   Respiratory: Negative.    Cardiovascular: Negative.   Gastrointestinal: Negative.   Genitourinary: Negative.   Musculoskeletal:  Positive  for joint pain.       Left great toe swelling, tenderness  Skin: Negative.   Neurological: Negative.   Endo/Heme/Allergies: Negative.   Psychiatric/Behavioral: Negative.     Past Medical History:  Diagnosis Date   Acid reflux    Asthma    Chronic UTI    Family history of adverse reaction to anesthesia    brother unable to wake up after surgery for approx one month   Hypertension     Past Surgical History:  Procedure Laterality Date   BREAST LUMPECTOMY WITH RADIOACTIVE SEED LOCALIZATION Right 02/12/2021   Procedure:  RIGHT BREAST LUMPECTOMY WITH RADIOACTIVE SEED LOCALIZATION;  Surgeon: Manus Rudd, MD;  Location: [facility];  Service: General;  Laterality: Right;   CESAREAN SECTION  1989   CHOLECYSTECTOMY  1994   TUBAL LIGATION  1998    Family History  Problem Relation Age of Onset   Hypertension Mother    Hyperlipidemia Mother    Diabetes Father    Breast cancer Sister    Hypertension Maternal Grandmother    Breast cancer Maternal Grandmother     Social History Reviewed with no changes to be made today.   Outpatient Medications Prior to Visit  Medication Sig Dispense Refill   albuterol (VENTOLIN HFA) 108 (90 Base) MCG/ACT inhaler Inhale 2 puffs into the lungs every 6 (six) hours as needed for wheezing or shortness of breath. (Patient not taking: Reported on 09/02/2023) 18 g 3   budesonide-formoterol (SYMBICORT) 80-4.5 MCG/ACT inhaler Inhale 2 puffs into the lungs 2 (two) times daily. (Patient not taking: Reported on 09/02/2023) 10.2 g 3   famotidine (PEPCID) 20 MG tablet Take 1 tablet (20 mg total) by mouth 2 (two) times daily. (Patient not taking: Reported on 09/02/2023) 60 tablet 2   hydrochlorothiazide (HYDRODIURIL) 12.5 MG tablet Take 1 tablet (12.5 mg total) by mouth daily. 30 tablet 2   valsartan (DIOVAN) 40 MG tablet Take 1 tablet (40 mg total) by mouth daily. 30 tablet 2   No facility-administered medications prior to visit.    Allergies  Allergen Reactions   Norvasc [Amlodipine Besylate] Other (See Comments)    Headaches.       Objective:    BP (!) 151/111 (BP Location: Left Arm, Patient Position: Sitting)   Pulse 83   Resp 20   Ht 5' 9.5" (1.765 m)   Wt 237 lb (107.5 kg)   LMP 10/04/2015 (Approximate)   SpO2 100%   BMI 34.50 kg/m  Wt Readings from Last 3 Encounters:  09/02/23 237 lb (107.5 kg)  08/26/21 247 lb 6.4 oz (112.2 kg)  02/12/21 238 lb 15.7 oz (108.4 kg)    Physical Exam Vitals and nursing note reviewed.  Constitutional:      Appearance:  Normal appearance. She is obese.  Cardiovascular:     Rate and Rhythm: Normal rate and regular rhythm.     Heart sounds: Normal heart sounds.  Pulmonary:     Effort: Pulmonary effort is normal.     Breath sounds: Normal breath sounds.  Musculoskeletal:        General: Swelling and tenderness present.     Cervical back: Normal range of motion.     Left foot: Decreased range of motion.       Feet:  Feet:     Right foot:     Toenail Condition: Right toenails are normal.     Left foot:     Skin integrity: Warmth present.  Toenail Condition: Left toenails are normal.     Comments: Left great toe tenderness and swelling Skin:    General: Skin is warm and dry.  Neurological:     General: No focal deficit present.     Mental Status: She is alert and oriented to person, place, and time.  Psychiatric:        Mood and Affect: Mood normal.        Behavior: Behavior normal.        Thought Content: Thought content normal.        Judgment: Judgment normal.        Patient has been counseled extensively about nutrition and exercise as well as the importance of adherence with medications and regular follow-up. The patient was given clear instructions to go to ER or return to medical center if symptoms don't improve, worsen or new problems develop. The patient verbalized understanding.   Follow-up: Return in about 4 weeks (around 09/30/2023) for follow up with [facility] for blood pressure check, and PCP in 3 months.   Joette Catching, BSN, RN  -student FNP  [facility] [place], [place] [phone]   09/02/2023, 6:08 PM

## 2023-09-03 ENCOUNTER — Encounter: Payer: Self-pay | Admitting: Nurse Practitioner

## 2023-09-03 ENCOUNTER — Other Ambulatory Visit: Payer: Self-pay | Admitting: Nurse Practitioner

## 2023-09-03 DIAGNOSIS — M1000 Idiopathic gout, unspecified site: Secondary | ICD-10-CM

## 2023-09-03 LAB — THYROID PANEL WITH TSH
Free Thyroxine Index: 2.1 (ref 1.2–4.9)
T3 Uptake Ratio: 29 % (ref 24–39)
T4, Total: 7.4 ug/dL (ref 4.5–12.0)
TSH: 1.96 u[IU]/mL (ref 0.450–4.500)

## 2023-09-03 LAB — LIPID PANEL
Chol/HDL Ratio: 2.9 ratio (ref 0.0–4.4)
Cholesterol, Total: 197 mg/dL (ref 100–199)
HDL: 67 mg/dL (ref >39)
LDL Chol Calc (NIH): 112 mg/dL — ABNORMAL HIGH (ref 0–99)
Triglycerides: 103 mg/dL (ref 0–149)
VLDL Cholesterol Cal: 18 mg/dL (ref 5–40)

## 2023-09-03 LAB — URIC ACID: Uric Acid: 10.4 mg/dL — ABNORMAL HIGH (ref 3.0–7.2)

## 2023-09-03 MED ORDER — ALLOPURINOL 100 MG PO TABS: 100.0000 mg | ORAL_TABLET | Freq: Every day | ORAL | 6 refills | Status: DC

## 2023-10-01 ENCOUNTER — Ambulatory Visit: Attending: Family Medicine | Admitting: Pharmacist

## 2023-10-01 ENCOUNTER — Encounter: Payer: Self-pay | Admitting: Pharmacist

## 2023-10-01 VITALS — BP 144/95 | HR 101

## 2023-10-01 DIAGNOSIS — I10 Essential (primary) hypertension: Secondary | ICD-10-CM | POA: Diagnosis not present

## 2023-10-01 MED ORDER — VALSARTAN 80 MG PO TABS
80.0000 mg | ORAL_TABLET | Freq: Every day | ORAL | 0 refills | Status: AC
Start: 2023-10-01 — End: ?

## 2023-10-01 NOTE — Progress Notes (Signed)
 S:     No chief complaint on file.  53 y.o. female who presents for hypertension evaluation, education, and management.   Patient was referred and last seen by Primary Care Provider, Winda Hastings, on 09/02/2023.  At last visit, BP was elevated and she was also tested for gout. Her uric acid level returned >10. For her HTN, hydrochlorothiazide  was added to her regimen.  PMH is significant for HTN, CKD, GERD, asthma.   Today, patient arrives in good spirits and presents without assistance. Denies dizziness, headache, blurred vision, swelling. Patient reports hypertension is longstanding. She did not start the hydrochlorothiazide . She has only been taking valsartan  daily in the morning.    Family/Social history:  Fhx: HTN, HLD, DM Tobacco: never smoker  Alcohol: none reported   Medication adherence reported. Patient has taken valsartan  today. Previously tried amlodipine  but stopped d/t HA.    Current antihypertensives include: hydrochlorothiazide  12.5 mg daily (not taking), valsartan  40 mg   Reported home BP readings: none   Patient reported dietary habits:  -Changed her diet to no-sodium.  -No caffeine-containing   Patient-reported exercise habits:  -on her feet at work   O:  ROS  Physical Exam  Last 3 Office BP readings: BP Readings from Last 3 Encounters:  10/01/23 (!) 144/95  09/02/23 (!) 151/111  08/24/23 (!) 153/111   BMET    Component Value Date/Time   NA 138 08/24/2023 1016   NA 142 08/26/2021 1622   K 4.1 08/24/2023 1016   CL 111 08/24/2023 1016   CO2 21 (L) 08/24/2023 1016   GLUCOSE 102 (H) 08/24/2023 1016   BUN 36 (H) 08/24/2023 1016   BUN 18 08/26/2021 1622   CREATININE 1.99 (H) 08/24/2023 1016   CALCIUM 9.6 08/24/2023 1016   GFRNONAA 29 (L) 08/24/2023 1016   GFRAA 59 (L) 09/13/2017 1906    Renal function: CrCl cannot be calculated (Patient's most recent lab result is older than the maximum 21 days allowed.).  Clinical ASCVD: No  The 10-year  ASCVD risk score (Arnett DK, et al., 2019) is: 5.6%   Values used to calculate the score:     Age: 25 years     Sex: Female     Is Non-Hispanic African American: Yes     Diabetic: No     Tobacco smoker: No     Systolic Blood Pressure: 144 mmHg     Is BP treated: Yes     HDL Cholesterol: 67 mg/dL     Total Cholesterol: 197 mg/dL  Patient is participating in a Managed Medicaid Plan: No    A/P: Hypertension longstanding currently above goal on current medications. BP goal < 130/80 mmHg. Medication adherence appears to be appropriate with valsartan . With her recent uric acid level, I recommend to hold off on starting HCTZ. Hydrochlorothiazide  can increase uric acid levels. With her current renal function and CKD hx, I recommend to titrate RAAS therapy instead. We will increase valsartan  dose and have her return in 2-4 weeks for recheck + labs. In the future, we can also consider starting losartan in place of the valsartan  as losartan has been shown to actually decrease uric acid levels.   -Discontinued HCTZ.  -Increased dose of valsartan  to 80 mg daily.  -Patient educated on purpose, proper use, and potential adverse effects of valsartan , HCTZ.  -F/u labs ordered - none today. Plan for BMP in 2-4 weeks.  -Counseled on lifestyle modifications for blood pressure control including reduced dietary sodium, increased exercise, adequate  sleep. -Encouraged patient to check BP at home and bring log of readings to next visit. Counseled on proper use of home BP cuff.   Results reviewed and written information provided.    Written patient instructions provided. Patient verbalized understanding of treatment plan.  Total time in face to face counseling 20 minutes.    Follow-up:  Pharmacist  in 1 month.  Marene Shape, PharmD, Becky Bowels, CPP Clinical Pharmacist [facility] [phone]

## 2023-10-07 ENCOUNTER — Encounter (HOSPITAL_COMMUNITY): Payer: Self-pay

## 2023-10-07 ENCOUNTER — Ambulatory Visit (INDEPENDENT_AMBULATORY_CARE_PROVIDER_SITE_OTHER)

## 2023-10-07 ENCOUNTER — Ambulatory Visit (HOSPITAL_COMMUNITY)
Admission: EM | Admit: 2023-10-07 | Discharge: 2023-10-07 | Disposition: A | Discharge status: Home / Self Care | Attending: Emergency Medicine | Admitting: Emergency Medicine

## 2023-10-07 ENCOUNTER — Ambulatory Visit: Payer: Self-pay | Admitting: Nurse Practitioner

## 2023-10-07 DIAGNOSIS — M109 Gout, unspecified: Secondary | ICD-10-CM | POA: Diagnosis not present

## 2023-10-07 DIAGNOSIS — M79671 Pain in right foot: Secondary | ICD-10-CM

## 2023-10-07 DIAGNOSIS — M10071 Idiopathic gout, right ankle and foot: Secondary | ICD-10-CM

## 2023-10-07 MED ORDER — PREDNISONE 20 MG PO TABS: 40.0000 mg | ORAL_TABLET | Freq: Every day | ORAL | 0 refills | Status: AC

## 2023-10-07 NOTE — ED Provider Notes (Signed)
 [facility]    CSN: [id number] Arrival date & time: 10/07/23  1552      History   Chief Complaint No chief complaint on file.   HPI Amy Barajas is a 53 y.o. female.   Patient presents to clinic over concern of right foot pain and swelling. This started yesterday. Has not had any injuries, trauma or falls that she can recall. Did have gout in her left great toe on 08/24/23. When she went to her PCP recently her uric acid levels were elevated and she was started on allopurinol , has been taking this as prescribed.   Has not eaten any shellfish or red meat recently. Does not drink ETOH.   Hx of HTN, asthma, CKD, GERD and gout.     The history is provided by the patient and medical records.    Past Medical History:  Diagnosis Date   Acid reflux    Asthma    Chronic UTI    Family history of adverse reaction to anesthesia    brother unable to wake up after surgery for approx one month   Hypertension     Patient Active Problem List   Diagnosis Date Noted   Gastroesophageal reflux disease without esophagitis 08/26/2021   Stage 3a chronic kidney disease (HCC) 08/26/2021   Mild asthma without complication 08/11/2018   Health care maintenance 08/11/2018   Essential hypertension 11/08/2014    Past Surgical History:  Procedure Laterality Date   BREAST LUMPECTOMY WITH RADIOACTIVE SEED LOCALIZATION Right 02/12/2021   Procedure: RIGHT BREAST LUMPECTOMY WITH RADIOACTIVE SEED LOCALIZATION;  Surgeon: Dareen Ebbing, MD;  Location: [facility];  Service: General;  Laterality: Right;   CESAREAN SECTION  1989   CHOLECYSTECTOMY  1994   TUBAL LIGATION  1998    OB History   No obstetric history on file.      Home Medications    Prior to Admission medications   Medication Sig Start Date End Date Taking? Authorizing Provider  albuterol  (VENTOLIN  HFA) 108 (90 Base) MCG/ACT inhaler Inhale 2 puffs into the lungs every 6 (six) hours as needed for  wheezing or shortness of breath. 09/02/23  Yes Fleming, Zelda W, NP  allopurinol  (ZYLOPRIM ) 100 MG tablet Take 1 tablet (100 mg total) by mouth daily. 09/03/23  Yes Fleming, Zelda W, NP  budesonide -formoterol  (SYMBICORT ) 80-4.5 MCG/ACT inhaler Inhale 2 puffs into the lungs 2 (two) times daily. 09/02/23  Yes Fleming, Zelda W, NP  famotidine  (PEPCID ) 20 MG tablet Take 1 tablet (20 mg total) by mouth 2 (two) times daily. 09/02/23  Yes Fleming, Zelda W, NP  predniSONE  (DELTASONE ) 20 MG tablet Take 2 tablets (40 mg total) by mouth daily for 5 days. 10/07/23 10/12/23 Yes Tashana Haberl  N, FNP  valsartan  (DIOVAN ) 80 MG tablet Take 1 tablet (80 mg total) by mouth daily. 10/01/23  Yes Joaquin Mulberry, MD    Family History Family History  Problem Relation Age of Onset   Hypertension Mother    Hyperlipidemia Mother    Diabetes Father    Breast cancer Sister    Hypertension Maternal Grandmother    Breast cancer Maternal Grandmother     Social History Social History   Tobacco Use   Smoking status: Never   Smokeless tobacco: Never  Vaping Use   Vaping status: Never Used  Substance Use Topics   Alcohol use: No   Drug use: No     Allergies   Norvasc  [amlodipine  besylate]   Review of Systems Review  of Systems  Per HPI  Physical Exam Triage Vital Signs ED Triage Vitals  Encounter Vitals Group     BP 10/07/23 1654 (!) 180/136     Systolic BP Percentile --      Diastolic BP Percentile --      Pulse Rate 10/07/23 1654 76     Resp 10/07/23 1654 18     Temp 10/07/23 1654 98.3 F (36.8 C)     Temp Source 10/07/23 1654 Oral     SpO2 10/07/23 1654 96 %     Weight --      Height --      Head Circumference --      Peak Flow --      Pain Score 10/07/23 1658 6     Pain Loc --      Pain Education --      Exclude from Growth Chart --    No data found.  Updated Vital Signs BP (!) 180/136 (BP Location: Left Arm)   Pulse 76   Temp 98.3 F (36.8 C) (Oral)   Resp 18   LMP 10/04/2015  (Approximate)   SpO2 96%   Visual Acuity Right Eye Distance:   Left Eye Distance:   Bilateral Distance:    Right Eye Near:   Left Eye Near:    Bilateral Near:     Physical Exam Vitals and nursing note reviewed.  Constitutional:      Appearance: Normal appearance.  HENT:     Head: Normocephalic and atraumatic.     Right Ear: External ear normal.     Left Ear: External ear normal.     Nose: Nose normal.     Mouth/Throat:     Mouth: Mucous membranes are moist.  Eyes:     Conjunctiva/sclera: Conjunctivae normal.  Cardiovascular:     Rate and Rhythm: Normal rate.  Pulmonary:     Effort: Pulmonary effort is normal. No respiratory distress.  Musculoskeletal:        General: Swelling and tenderness present.       Feet:  Skin:    General: Skin is warm and dry.     Findings: Erythema present.  Neurological:     General: No focal deficit present.     Mental Status: She is alert.  Psychiatric:        Mood and Affect: Mood normal.      UC Treatments / Results  Labs (all labs ordered are listed, but only abnormal results are displayed) Labs Reviewed - No data to display  EKG   Radiology DG Foot Complete Right Result Date: 10/07/2023 CLINICAL DATA:  Right foot pain and swelling for 2 weeks without known injury. EXAM: RIGHT FOOT COMPLETE - 3+ VIEW COMPARISON:  None Available. FINDINGS: There is no evidence of fracture or dislocation. There is no evidence of arthropathy or other focal bone abnormality. Soft tissues are unremarkable. IMPRESSION: Negative. Electronically Signed   By: Rosalene Colon M.D.   On: 10/07/2023 17:30    Procedures Procedures (including critical care time)  Medications Ordered in UC Medications - No data to display  Initial Impression / Assessment and Plan / UC Course  I have reviewed the triage vital signs and the nursing notes.  Pertinent labs & imaging results that were available during my care of the patient were reviewed by me and  considered in my medical decision making (see chart for details).  Vitals in triage reviewed, patient is hemodynamically stable.  Right foot with  swelling, tenderness over the fifth metatarsal, no obvious deformity or injury.  Patient requesting advanced imaging, does not show any acute fractures or abnormalities per my interpretation.  Radiology overread shows no acute bony abnormalities.  Suspect gout flareup with recent elevated uric acid.  With chronic kidney disease patient is not a good candidate for NSAIDs, will start on prednisone  burst, as this worked well before.  Work note provided.  Plan of care, follow-up care return precautions given, no questions at this time.     Final Clinical Impressions(s) / UC Diagnoses   Final diagnoses:  Right foot pain  Acute gout of right foot, unspecified cause     Discharge Instructions      Your x-ray did not show any bony abnormalities.  Your physical exam and presentation are concerning for a gout flare.  Start the prednisone  tomorrow with breakfast for the next 5 days.  You can wear the boot to help protect your foot.  Elevate the foot as needed.  Please rest.  Symptoms gout can be triggered by dietary intake, or by chronic kidney disease.  Follow-up with your primary care provider for continued management of your gout.     ED Prescriptions     Medication Sig Dispense Auth. Provider   predniSONE  (DELTASONE ) 20 MG tablet Take 2 tablets (40 mg total) by mouth daily for 5 days. 10 tablet Harlow Lighter, Eulalah Rupert  N, FNP      PDMP not reviewed this encounter.   Zelda Hickman, FNP 10/07/23 1737

## 2023-10-07 NOTE — ED Triage Notes (Signed)
 Patient c/o right foot pain and swelling x 2 weeks. Patient states she has a history of gout.

## 2023-10-07 NOTE — Discharge Instructions (Addendum)
 Your x-ray did not show any bony abnormalities.  Your physical exam and presentation are concerning for a gout flare.  Start the prednisone  tomorrow with breakfast for the next 5 days.  You can wear the boot to help protect your foot.  Elevate the foot as needed.  Please rest.  Symptoms gout can be triggered by dietary intake, or by chronic kidney disease.  Follow-up with your primary care provider for continued management of your gout.

## 2023-10-07 NOTE — Telephone Encounter (Signed)
 Chief Complaint: Right foot pain started two days ago  Symptoms: Pain causes her to have difficulty in walking, 10/10 pain level constant, a little swelling, sensitive to touch, warm to touch, a little numb Pertinent Negatives: Patient denies pain radiation, fever  Disposition: [x] Urgent Care (no appt availability in office)  Additional Notes: Pt states she was diagnosed with gout in her left foot about a month ago. Pt states the pain feels similar in her right foot. Pt advised to go to urgent care today and have another adult drive as no appointment availability in office. Pt is agreeable to plan.    Copied from CRM [id number]. Topic: Clinical - Red Word Triage >> Oct 07, 2023  1:39 PM [place] wrote: Red Word that prompted transfer to Nurse Triage: Right foot pain level 10- has gout in left foot, pain radiates from ankle to foot Reason for Disposition  [1] SEVERE pain (e.g., excruciating, unable to do any normal activities) AND [2] not improved after 2 hours of pain medicine  Answer Assessment - Initial Assessment Questions Chief Complaint: Right foot pain started two days ago  Symptoms: Pain causes her to have difficulty in walking, 10/10 pain level constant, a little swelling, sensitive to touch, warm to touch, a little numb  Pertinent Negatives: Patient denies pain radiation, fever  Protocols used: Foot Pain-A-AH

## 2023-10-29 ENCOUNTER — Telehealth: Payer: Self-pay

## 2023-10-29 NOTE — Telephone Encounter (Signed)
 Amy Barajas has been informed of forms being completed and needing to complete her information in section one. Patient stated that she will come in to fill on that section and pick up a copy.

## 2023-11-02 ENCOUNTER — Ambulatory Visit: Admitting: Pharmacist

## 2023-11-12 NOTE — Progress Notes (Incomplete)
 S:     No chief complaint on file.  53 y.o. female who presents for hypertension evaluation, education, and management.   Patient was referred and last seen by Primary Care Provider, Winda Hastings, on 09/02/2023.  At last visit, BP was elevated and she was also tested for gout. Her uric acid level returned >10. For her HTN, hydrochlorothiazide  was added to her regimen. At last visit with pharmacy on 10/01/23, hydrochlorothiazide  was discontinued (in setting of gout) and valsartan  was increased to 80 mg daily.  PMH is significant for HTN, CKD, GERD, asthma.   Today, patient arrives in good spirits and presents without assistance. Denies dizziness, headache, blurred vision, swelling. *** Patient reports hypertension is longstanding. She successfully increased valsartan  to 80 mg daily. ***   Family/Social history:  Fhx: HTN, HLD, DM Tobacco: never smoker  Alcohol: none reported   Medication adherence reported. Patient has taken valsartan  today. Previously tried amlodipine  but stopped d/t HA.  ***  Current antihypertensives include: valsartan  80 mg   Reported home BP readings: none ***  Patient reported dietary habits:  -Changed her diet to no-sodium.  -No caffeine-containing   Patient-reported exercise habits:  -on her feet at work   O: Last 3 Office BP readings: BP Readings from Last 3 Encounters:  10/07/23 (!) 180/136  10/01/23 (!) 144/95  09/02/23 (!) 151/111   BMET    Component Value Date/Time   NA 138 08/24/2023 1016   NA 142 08/26/2021 1622   K 4.1 08/24/2023 1016   CL 111 08/24/2023 1016   CO2 21 (L) 08/24/2023 1016   GLUCOSE 102 (H) 08/24/2023 1016   BUN 36 (H) 08/24/2023 1016   BUN 18 08/26/2021 1622   CREATININE 1.99 (H) 08/24/2023 1016   CALCIUM 9.6 08/24/2023 1016   GFRNONAA 29 (L) 08/24/2023 1016   GFRAA 59 (L) 09/13/2017 1906    Renal function: CrCl cannot be calculated (Patient's most recent lab result is older than the maximum 21 days  allowed.).  Clinical ASCVD: No  The 10-year ASCVD risk score (Arnett DK, et al., 2019) is: 12.2%   Values used to calculate the score:     Age: 47 years     Clincally relevant sex: Female     Is Non-Hispanic African American: Yes     Diabetic: No     Tobacco smoker: No     Systolic Blood Pressure: 180 mmHg     Is BP treated: Yes     HDL Cholesterol: 67 mg/dL     Total Cholesterol: 197 mg/dL  Patient is participating in a Managed Medicaid Plan: No    A/P: Hypertension longstanding currently above goal on current medications. BP goal < 130/80 mmHg. Medication adherence appears to be appropriate with valsartan . ***  In the future, we can also consider starting losartan in place of the valsartan  as losartan has been shown to actually decrease uric acid levels.   -Increased dose of valsartan  to 80 mg daily. *** -Patient educated on purpose, proper use, and potential adverse effects of valsartan .  -F/u labs ordered - none today. Plan for BMP in 2-4 weeks. ***  -Counseled on lifestyle modifications for blood pressure control including reduced dietary sodium, increased exercise, adequate sleep. -Encouraged patient to check BP at home and bring log of readings to next visit. Counseled on proper use of home BP cuff.   Results reviewed and written information provided.    Written patient instructions provided. Patient verbalized understanding of treatment plan.  Total  time in face to face counseling 20 minutes.    Follow-up:  Pharmacist: *** PCP: 12/02/2023  Juleen Oakland, PharmD PGY1 Pharmacy Resident

## 2023-11-13 ENCOUNTER — Ambulatory Visit: Admitting: Pharmacist

## 2023-12-01 ENCOUNTER — Telehealth: Payer: Self-pay | Admitting: Nurse Practitioner

## 2023-12-01 NOTE — Telephone Encounter (Signed)
 Contacted pt to confirmed appt pt requested to resch her appt Mondays only next ava July 21

## 2023-12-02 ENCOUNTER — Ambulatory Visit: Admitting: Nurse Practitioner

## 2023-12-07 ENCOUNTER — Telehealth: Payer: Self-pay | Admitting: Nurse Practitioner

## 2023-12-07 ENCOUNTER — Other Ambulatory Visit: Payer: Self-pay | Admitting: Nurse Practitioner

## 2023-12-07 ENCOUNTER — Other Ambulatory Visit: Payer: Self-pay

## 2023-12-07 DIAGNOSIS — M1000 Idiopathic gout, unspecified site: Secondary | ICD-10-CM

## 2023-12-07 MED ORDER — ALLOPURINOL 100 MG PO TABS
200.0000 mg | ORAL_TABLET | Freq: Every day | ORAL | 0 refills | Status: AC
Start: 2023-12-07 — End: ?
  Filled 2023-12-07 – 2023-12-25 (×2): qty 180, 90d supply, fill #0

## 2023-12-07 NOTE — Telephone Encounter (Signed)
 Patient identified by name and date of birth.  Patient was informed that Ms. Theotis is not able to extend her FLMA or days off if she has a gout flare.  Patient was confused as to why and I did explain that per Ms. Theotis she should not be having the flare ups if she is taking her medication and eating healthy. Ms. Hagger did state that she is no longer taking the allopurinol  because it doesn't work. After relaying the response to Ms. Theotis an increase in the allopurinol  was suggested and sent to the pharmacy for Ms. Plasencia. Ms. Egelhoff voiced understanding.

## 2023-12-07 NOTE — Telephone Encounter (Signed)
 Copied from CRM [id number]. Topic: General - Other >> Dec 07, 2023 10:34 AM Viola F wrote:  Reason for CRM: Patient called to follow up on FMLA paperwork that was faxed 2-3 weeks ago from her job. Please call her at [phone] with an update.

## 2023-12-16 ENCOUNTER — Other Ambulatory Visit: Payer: Self-pay

## 2023-12-18 ENCOUNTER — Telehealth: Payer: Self-pay | Admitting: Nurse Practitioner

## 2023-12-18 NOTE — Telephone Encounter (Signed)
 Pt confirmed appt

## 2023-12-21 ENCOUNTER — Ambulatory Visit: Admitting: Nurse Practitioner

## 2023-12-25 ENCOUNTER — Other Ambulatory Visit: Payer: Self-pay

## 2024-01-25 ENCOUNTER — Other Ambulatory Visit: Payer: Self-pay | Admitting: Family Medicine

## 2024-01-25 DIAGNOSIS — Z1231 Encounter for screening mammogram for malignant neoplasm of breast: Secondary | ICD-10-CM

## 2024-02-02 ENCOUNTER — Telehealth: Payer: Self-pay | Admitting: Nurse Practitioner

## 2024-02-02 NOTE — Telephone Encounter (Signed)
 Contacted pt to confirmed appt she said she went ot another clinic to cancel appt

## 2024-02-03 ENCOUNTER — Ambulatory Visit: Admitting: Nurse Practitioner

## 2024-02-03 ENCOUNTER — Ambulatory Visit (HOSPITAL_COMMUNITY)

## 2024-02-09 ENCOUNTER — Ambulatory Visit

## 2024-02-15 ENCOUNTER — Ambulatory Visit
Admission: RE | Admit: 2024-02-15 | Discharge: 2024-02-15 | Disposition: A | Discharge status: Home / Self Care | Source: Ambulatory Visit | Attending: Family Medicine | Admitting: Family Medicine

## 2024-02-15 DIAGNOSIS — Z1231 Encounter for screening mammogram for malignant neoplasm of breast: Secondary | ICD-10-CM

## 2024-02-16 ENCOUNTER — Other Ambulatory Visit: Payer: Self-pay | Admitting: Internal Medicine

## 2024-02-16 DIAGNOSIS — N184 Chronic kidney disease, stage 4 (severe): Secondary | ICD-10-CM

## 2024-02-18 ENCOUNTER — Other Ambulatory Visit

## 2024-02-22 ENCOUNTER — Ambulatory Visit
Admission: RE | Admit: 2024-02-22 | Discharge: 2024-02-22 | Disposition: A | Discharge status: Home / Self Care | Source: Ambulatory Visit | Attending: Internal Medicine | Admitting: Internal Medicine

## 2024-02-22 DIAGNOSIS — N184 Chronic kidney disease, stage 4 (severe): Secondary | ICD-10-CM

## 2024-03-24 ENCOUNTER — Other Ambulatory Visit: Payer: Self-pay | Admitting: Internal Medicine

## 2024-03-24 DIAGNOSIS — R1900 Intra-abdominal and pelvic swelling, mass and lump, unspecified site: Secondary | ICD-10-CM

## 2024-03-29 ENCOUNTER — Encounter: Payer: Self-pay | Admitting: Internal Medicine

## 2024-03-31 ENCOUNTER — Inpatient Hospital Stay: Admission: RE | Admit: 2024-03-31 | Source: Ambulatory Visit

## 2024-04-07 ENCOUNTER — Other Ambulatory Visit: Payer: Self-pay | Admitting: Internal Medicine

## 2024-04-07 ENCOUNTER — Inpatient Hospital Stay
Admission: RE | Admit: 2024-04-07 | Discharge: 2024-04-07 | Disposition: A | Source: Ambulatory Visit | Attending: Internal Medicine | Admitting: Internal Medicine

## 2024-04-07 DIAGNOSIS — R1900 Intra-abdominal and pelvic swelling, mass and lump, unspecified site: Secondary | ICD-10-CM

## 2024-05-17 ENCOUNTER — Telehealth: Payer: Self-pay

## 2024-05-17 NOTE — Telephone Encounter (Signed)
 Left a message for the patient to call the office patient is being referred for  [place], referral is on proficient.
